# Patient Record
Sex: Female | Born: 1990
Health system: Southern US, Community
[De-identification: ages and names within clinical notes are randomized; demographics above are authoritative.]

## PROBLEM LIST (undated history)

## (undated) ENCOUNTER — Inpatient Hospital Stay (HOSPITAL_COMMUNITY): Payer: Self-pay

## (undated) DIAGNOSIS — M549 Dorsalgia, unspecified: Secondary | ICD-10-CM

## (undated) DIAGNOSIS — E669 Obesity, unspecified: Secondary | ICD-10-CM

## (undated) DIAGNOSIS — F1910 Other psychoactive substance abuse, uncomplicated: Secondary | ICD-10-CM

## (undated) DIAGNOSIS — R7303 Prediabetes: Secondary | ICD-10-CM

## (undated) DIAGNOSIS — T4145XA Adverse effect of unspecified anesthetic, initial encounter: Secondary | ICD-10-CM

## (undated) DIAGNOSIS — Z789 Other specified health status: Secondary | ICD-10-CM

## (undated) DIAGNOSIS — E282 Polycystic ovarian syndrome: Secondary | ICD-10-CM

## (undated) DIAGNOSIS — K760 Fatty (change of) liver, not elsewhere classified: Secondary | ICD-10-CM

## (undated) DIAGNOSIS — T8859XA Other complications of anesthesia, initial encounter: Secondary | ICD-10-CM

## (undated) DIAGNOSIS — R51 Headache: Secondary | ICD-10-CM

## (undated) DIAGNOSIS — I10 Essential (primary) hypertension: Secondary | ICD-10-CM

## (undated) DIAGNOSIS — I517 Cardiomegaly: Secondary | ICD-10-CM

## (undated) DIAGNOSIS — R0602 Shortness of breath: Secondary | ICD-10-CM

## (undated) DIAGNOSIS — O09299 Supervision of pregnancy with other poor reproductive or obstetric history, unspecified trimester: Secondary | ICD-10-CM

## (undated) DIAGNOSIS — R079 Chest pain, unspecified: Secondary | ICD-10-CM

## (undated) DIAGNOSIS — O139 Gestational [pregnancy-induced] hypertension without significant proteinuria, unspecified trimester: Secondary | ICD-10-CM

## (undated) DIAGNOSIS — E119 Type 2 diabetes mellitus without complications: Secondary | ICD-10-CM

## (undated) HISTORY — DX: Dorsalgia, unspecified: M54.9

## (undated) HISTORY — DX: Fatty (change of) liver, not elsewhere classified: K76.0

## (undated) HISTORY — DX: Prediabetes: R73.03

## (undated) HISTORY — DX: Essential (primary) hypertension: I10

## (undated) HISTORY — DX: Polycystic ovarian syndrome: E28.2

## (undated) HISTORY — DX: Other psychoactive substance abuse, uncomplicated: F19.10

## (undated) HISTORY — DX: Type 2 diabetes mellitus without complications: E11.9

## (undated) HISTORY — DX: Supervision of pregnancy with other poor reproductive or obstetric history, unspecified trimester: O09.299

## (undated) HISTORY — DX: Chest pain, unspecified: R07.9

---
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
## 2008-03-22 ENCOUNTER — Emergency Department (HOSPITAL_COMMUNITY): Admission: EM | Admit: 2008-03-22 | Discharge: 2008-03-22 | Payer: Self-pay | Admitting: Emergency Medicine

## 2008-04-02 ENCOUNTER — Inpatient Hospital Stay (HOSPITAL_COMMUNITY): Admission: AD | Admit: 2008-04-02 | Discharge: 2008-04-02 | Payer: Self-pay | Admitting: Obstetrics & Gynecology

## 2008-04-17 ENCOUNTER — Inpatient Hospital Stay (HOSPITAL_COMMUNITY): Admission: AD | Admit: 2008-04-17 | Discharge: 2008-04-18 | Payer: Self-pay | Admitting: Obstetrics and Gynecology

## 2008-06-14 ENCOUNTER — Ambulatory Visit (HOSPITAL_COMMUNITY): Admission: RE | Admit: 2008-06-14 | Discharge: 2008-06-14 | Payer: Self-pay | Admitting: Obstetrics

## 2008-09-13 ENCOUNTER — Ambulatory Visit (HOSPITAL_COMMUNITY): Admission: RE | Admit: 2008-09-13 | Discharge: 2008-09-13 | Payer: Self-pay | Admitting: Obstetrics

## 2008-11-15 ENCOUNTER — Inpatient Hospital Stay (HOSPITAL_COMMUNITY): Admission: AD | Admit: 2008-11-15 | Discharge: 2008-11-17 | Payer: Self-pay | Admitting: Obstetrics

## 2008-11-22 ENCOUNTER — Inpatient Hospital Stay (HOSPITAL_COMMUNITY): Admission: AD | Admit: 2008-11-22 | Discharge: 2008-11-27 | Payer: Self-pay | Admitting: Obstetrics & Gynecology

## 2008-11-24 ENCOUNTER — Encounter: Payer: Self-pay | Admitting: Obstetrics

## 2008-12-01 ENCOUNTER — Ambulatory Visit: Payer: Self-pay | Admitting: Cardiology

## 2008-12-01 ENCOUNTER — Inpatient Hospital Stay (HOSPITAL_COMMUNITY): Admission: AD | Admit: 2008-12-01 | Discharge: 2008-12-05 | Payer: Self-pay | Admitting: Obstetrics

## 2008-12-02 ENCOUNTER — Encounter (INDEPENDENT_AMBULATORY_CARE_PROVIDER_SITE_OTHER): Payer: Self-pay | Admitting: Emergency Medicine

## 2008-12-20 ENCOUNTER — Ambulatory Visit: Payer: Self-pay | Admitting: Cardiology

## 2008-12-27 ENCOUNTER — Ambulatory Visit: Payer: Self-pay

## 2008-12-27 ENCOUNTER — Encounter: Payer: Self-pay | Admitting: Cardiology

## 2009-02-12 DIAGNOSIS — I10 Essential (primary) hypertension: Secondary | ICD-10-CM

## 2009-02-12 DIAGNOSIS — E8770 Fluid overload, unspecified: Secondary | ICD-10-CM | POA: Insufficient documentation

## 2009-02-12 DIAGNOSIS — IMO0002 Reserved for concepts with insufficient information to code with codable children: Secondary | ICD-10-CM | POA: Insufficient documentation

## 2009-02-12 DIAGNOSIS — I517 Cardiomegaly: Secondary | ICD-10-CM | POA: Insufficient documentation

## 2009-02-12 DIAGNOSIS — I319 Disease of pericardium, unspecified: Secondary | ICD-10-CM | POA: Insufficient documentation

## 2009-02-13 ENCOUNTER — Encounter: Payer: Self-pay | Admitting: Cardiology

## 2009-02-13 ENCOUNTER — Ambulatory Visit: Payer: Self-pay | Admitting: Cardiology

## 2009-08-18 ENCOUNTER — Inpatient Hospital Stay (HOSPITAL_COMMUNITY): Admission: AD | Admit: 2009-08-18 | Discharge: 2009-08-18 | Payer: Self-pay | Admitting: Obstetrics & Gynecology

## 2009-08-29 ENCOUNTER — Inpatient Hospital Stay (HOSPITAL_COMMUNITY): Admission: AD | Admit: 2009-08-29 | Discharge: 2009-08-29 | Payer: Self-pay | Admitting: Obstetrics

## 2009-08-29 ENCOUNTER — Ambulatory Visit: Payer: Self-pay | Admitting: Family

## 2010-01-11 ENCOUNTER — Inpatient Hospital Stay (HOSPITAL_COMMUNITY): Admission: RE | Admit: 2010-01-11 | Discharge: 2010-01-14 | Payer: Self-pay | Admitting: Obstetrics

## 2010-09-24 ENCOUNTER — Emergency Department (HOSPITAL_COMMUNITY): Admission: EM | Admit: 2010-09-24 | Discharge: 2010-09-25 | Payer: Self-pay | Admitting: Emergency Medicine

## 2011-01-23 LAB — URINALYSIS, ROUTINE W REFLEX MICROSCOPIC
Nitrite: NEGATIVE
Specific Gravity, Urine: 1.018 (ref 1.005–1.030)
Urobilinogen, UA: 0.2 mg/dL (ref 0.0–1.0)
pH: 6.5 (ref 5.0–8.0)

## 2011-01-23 LAB — POCT PREGNANCY, URINE: Preg Test, Ur: NEGATIVE

## 2011-01-23 LAB — RAPID STREP SCREEN (MED CTR MEBANE ONLY): Streptococcus, Group A Screen (Direct): NEGATIVE

## 2011-02-03 LAB — CBC
HCT: 29.8 % — ABNORMAL LOW (ref 36.0–46.0)
Hemoglobin: 9.8 g/dL — ABNORMAL LOW (ref 12.0–15.0)
MCV: 84.8 fL (ref 78.0–100.0)
Platelets: 186 10*3/uL (ref 150–400)
RBC: 4.11 MIL/uL (ref 3.87–5.11)
RDW: 15 % (ref 11.5–15.5)
WBC: 10.3 10*3/uL (ref 4.0–10.5)
WBC: 10.9 10*3/uL — ABNORMAL HIGH (ref 4.0–10.5)

## 2011-02-03 LAB — RPR: RPR Ser Ql: NONREACTIVE

## 2011-02-25 LAB — URINE MICROSCOPIC-ADD ON

## 2011-02-25 LAB — BASIC METABOLIC PANEL
BUN: 4 mg/dL — ABNORMAL LOW (ref 6–23)
BUN: 5 mg/dL — ABNORMAL LOW (ref 6–23)
BUN: 8 mg/dL (ref 6–23)
CO2: 28 mEq/L (ref 19–32)
Calcium: 8 mg/dL — ABNORMAL LOW (ref 8.4–10.5)
Calcium: 8.2 mg/dL — ABNORMAL LOW (ref 8.4–10.5)
Calcium: 9.2 mg/dL (ref 8.4–10.5)
Chloride: 102 mEq/L (ref 96–112)
Chloride: 106 mEq/L (ref 96–112)
Creatinine, Ser: 0.75 mg/dL (ref 0.4–1.2)
Creatinine, Ser: 0.91 mg/dL (ref 0.4–1.2)
Creatinine, Ser: 0.91 mg/dL (ref 0.4–1.2)
GFR calc Af Amer: >60 mL/min (ref >60)
GFR calc Af Amer: >60 mL/min (ref >60)
GFR calc Af Amer: >60 mL/min (ref >60)
GFR calc non Af Amer: >60 mL/min (ref >60)
GFR calc non Af Amer: >60 mL/min (ref >60)
Glucose, Bld: 72 mg/dL (ref 70–99)
Glucose, Bld: 85 mg/dL (ref 70–99)
Potassium: 3.4 mEq/L — ABNORMAL LOW (ref 3.5–5.1)
Potassium: 4 mEq/L (ref 3.5–5.1)
Sodium: 141 mEq/L (ref 135–145)

## 2011-02-25 LAB — CBC
HCT: 31.2 % — ABNORMAL LOW (ref 36.0–46.0)
HCT: 23.9 % — ABNORMAL LOW (ref 36.0–46.0)
HCT: 24.3 % — ABNORMAL LOW (ref 36.0–46.0)
Hemoglobin: 10.5 g/dL — ABNORMAL LOW (ref 12.0–15.0)
Hemoglobin: 10.6 g/dL — ABNORMAL LOW (ref 12.0–15.0)
Hemoglobin: 8.1 g/dL — ABNORMAL LOW (ref 12.0–15.0)
Hemoglobin: 7.7 g/dL — CL (ref 12.0–15.0)
Hemoglobin: 8 g/dL — ABNORMAL LOW (ref 12.0–15.0)
Hemoglobin: 8.2 g/dL — ABNORMAL LOW (ref 12.0–15.0)
Hemoglobin: 8.6 g/dL — ABNORMAL LOW (ref 12.0–15.0)
MCHC: 33.4 g/dL (ref 30.0–36.0)
MCHC: 33.8 g/dL (ref 30.0–36.0)
MCHC: 34.4 g/dL (ref 30.0–36.0)
MCV: 84.9 fL (ref 78.0–100.0)
MCV: 86 fL (ref 78.0–100.0)
MCV: 85.6 fL (ref 78.0–100.0)
MCV: 86.8 fL (ref 78.0–100.0)
Platelets: 207 10*3/uL (ref 150–400)
Platelets: 213 10*3/uL (ref 150–400)
Platelets: 395 10*3/uL (ref 150–400)
RBC: 2.78 MIL/uL — ABNORMAL LOW (ref 3.87–5.11)
RBC: 3.66 MIL/uL — ABNORMAL LOW (ref 3.87–5.11)
RBC: 2.7 MIL/uL — ABNORMAL LOW (ref 3.87–5.11)
RBC: 2.75 MIL/uL — ABNORMAL LOW (ref 3.87–5.11)
RBC: 2.99 MIL/uL — ABNORMAL LOW (ref 3.87–5.11)
RDW: 14.7 % (ref 11.5–15.5)
RDW: 15.2 % (ref 11.5–15.5)
RDW: 15.2 % (ref 11.5–15.5)
RDW: 15.3 % (ref 11.5–15.5)
WBC: 12 10*3/uL — ABNORMAL HIGH (ref 4.0–10.5)
WBC: 12.3 10*3/uL — ABNORMAL HIGH (ref 4.0–10.5)
WBC: 13.2 10*3/uL — ABNORMAL HIGH (ref 4.0–10.5)
WBC: 10.2 10*3/uL (ref 4.0–10.5)
WBC: 10.5 10*3/uL (ref 4.0–10.5)
WBC: 6.2 10*3/uL (ref 4.0–10.5)

## 2011-02-25 LAB — COMPREHENSIVE METABOLIC PANEL
ALT: 21 U/L (ref 0–35)
ALT: 35 U/L (ref 0–35)
Albumin: 2.5 g/dL — ABNORMAL LOW (ref 3.5–5.2)
Albumin: 2.3 g/dL — ABNORMAL LOW (ref 3.5–5.2)
Albumin: 2.5 g/dL — ABNORMAL LOW (ref 3.5–5.2)
Alkaline Phosphatase: 210 U/L — ABNORMAL HIGH (ref 39–117)
Alkaline Phosphatase: 222 U/L — ABNORMAL HIGH (ref 39–117)
Alkaline Phosphatase: 123 U/L — ABNORMAL HIGH (ref 39–117)
Alkaline Phosphatase: 125 U/L — ABNORMAL HIGH (ref 39–117)
Alkaline Phosphatase: 130 U/L — ABNORMAL HIGH (ref 39–117)
BUN: 2 mg/dL — ABNORMAL LOW (ref 6–23)
BUN: 3 mg/dL — ABNORMAL LOW (ref 6–23)
BUN: 2 mg/dL — ABNORMAL LOW (ref 6–23)
BUN: 5 mg/dL — ABNORMAL LOW (ref 6–23)
CO2: 22 mEq/L (ref 19–32)
CO2: 25 mEq/L (ref 19–32)
CO2: 26 mEq/L (ref 19–32)
Calcium: 7.8 mg/dL — ABNORMAL LOW (ref 8.4–10.5)
Calcium: 8 mg/dL — ABNORMAL LOW (ref 8.4–10.5)
Chloride: 101 mEq/L (ref 96–112)
Chloride: 105 mEq/L (ref 96–112)
Chloride: 101 mEq/L (ref 96–112)
Chloride: 107 mEq/L (ref 96–112)
Chloride: 109 mEq/L (ref 96–112)
Creatinine, Ser: 0.59 mg/dL (ref 0.4–1.2)
Creatinine, Ser: 0.77 mg/dL (ref 0.4–1.2)
Creatinine, Ser: 0.81 mg/dL (ref 0.4–1.2)
GFR calc non Af Amer: >60 mL/min (ref >60)
GFR calc non Af Amer: >60 mL/min (ref >60)
GFR calc non Af Amer: >60 mL/min (ref >60)
Glucose, Bld: 85 mg/dL (ref 70–99)
Glucose, Bld: 93 mg/dL (ref 70–99)
Glucose, Bld: 87 mg/dL (ref 70–99)
Glucose, Bld: 89 mg/dL (ref 70–99)
Glucose, Bld: 97 mg/dL (ref 70–99)
Potassium: 3.5 mEq/L (ref 3.5–5.1)
Potassium: 3.6 mEq/L (ref 3.5–5.1)
Potassium: 3.2 mEq/L — ABNORMAL LOW (ref 3.5–5.1)
Potassium: 3.8 mEq/L (ref 3.5–5.1)
Sodium: 137 mEq/L (ref 135–145)
Sodium: 140 mEq/L (ref 135–145)
Total Bilirubin: 0.2 mg/dL — ABNORMAL LOW (ref 0.3–1.2)
Total Bilirubin: 0.5 mg/dL (ref 0.3–1.2)
Total Bilirubin: 0.2 mg/dL — ABNORMAL LOW (ref 0.3–1.2)
Total Bilirubin: 0.3 mg/dL (ref 0.3–1.2)
Total Bilirubin: 0.5 mg/dL (ref 0.3–1.2)
Total Protein: 6 g/dL (ref 6.0–8.3)
Total Protein: 6.3 g/dL (ref 6.0–8.3)

## 2011-02-25 LAB — URIC ACID
Uric Acid, Serum: 4.4 mg/dL (ref 2.4–7.0)
Uric Acid, Serum: 4.9 mg/dL (ref 2.4–7.0)
Uric Acid, Serum: 6.6 mg/dL (ref 2.4–7.0)
Uric Acid, Serum: 7.4 mg/dL — ABNORMAL HIGH (ref 2.4–7.0)
Uric Acid, Serum: 7.8 mg/dL — ABNORMAL HIGH (ref 2.4–7.0)

## 2011-02-25 LAB — MAGNESIUM
Magnesium: 3.9 mg/dL — ABNORMAL HIGH (ref 1.5–2.5)
Magnesium: 4.3 mg/dL — ABNORMAL HIGH (ref 1.5–2.5)

## 2011-02-25 LAB — URINALYSIS, ROUTINE W REFLEX MICROSCOPIC
Bilirubin Urine: NEGATIVE
Bilirubin Urine: NEGATIVE
Glucose, UA: NEGATIVE mg/dL
Ketones, ur: NEGATIVE mg/dL
Ketones, ur: NEGATIVE mg/dL
Nitrite: NEGATIVE
Protein, ur: NEGATIVE mg/dL
Protein, ur: 30 mg/dL — AB
Urobilinogen, UA: 0.2 mg/dL (ref 0.0–1.0)
Urobilinogen, UA: 0.2 mg/dL (ref 0.0–1.0)

## 2011-02-25 LAB — RPR
RPR Ser Ql: NONREACTIVE
RPR Ser Ql: NONREACTIVE

## 2011-02-25 LAB — LACTATE DEHYDROGENASE
LDH: 152 U/L (ref 94–250)
LDH: 180 U/L (ref 94–250)
LDH: 292 U/L — ABNORMAL HIGH (ref 94–250)
LDH: 311 U/L — ABNORMAL HIGH (ref 94–250)

## 2011-02-25 LAB — PHOSPHORUS: Phosphorus: 4.4 mg/dL (ref 2.3–4.6)

## 2011-03-26 NOTE — Discharge Summary (Signed)
Frances [name], Frances [name]              ACCOUNT NO.:  [id number]   MEDICAL RECORD NO.:  [id number]          PATIENT TYPE:  INP   LOCATION:  9306                          [facility]ILITY:  [facility]   PHYSICIAN:  Charles A. Clearance Coots, M.D.DATE OF BIRTH:  [birth year]   DATE OF ADMISSION:  12/01/2008  DATE OF DISCHARGE:  12/05/2008                               DISCHARGE SUMMARY   ADMITTING DIAGNOSIS:  Postpartum preeclampsia.   DISCHARGE DIAGNOSES:  1. Postpartum preeclampsia.  2. Pulmonary edema.  3. Discharged home on post hospital day #4.  Much improved.  Good      condition.   NARRATIVE:  An 20 year old G1, P1, status post cesarean section on  November 24, 2008, presented to the office with shortness of breath.  On  evaluation, blood pressure was noted to be 166/108.  The patient was  sent to [facility] for workup and admission for  preeclampsia.   PAST MEDICAL HISTORY:  SURGERY:  Cesarean section.  ILLNESSES:  None.   MEDICATIONS:  Prenatal vitamins, labetalol, Percocet, and ibuprofen.   ALLERGIES:  No known drug allergies.   SOCIAL HISTORY:  Single.  Positive tobacco.  Negative alcohol or  recreational drug.   PHYSICAL EXAMINATION:  GENERAL:  Afebrile.  VITAL SIGNS:  Blood pressure 166/104 and weight 340 pounds.  LUNGS:  Clear to auscultation bilaterally.  HEART:  Regular rate and rhythm.  ABDOMEN:  Soft, nontender, and obese.  Incision is clean, dry and  intact.   ADMITTING LABS:  Hemoglobin of 7.7, hematocrit 23.9, white blood cell  count 10,500, and platelets 373,000.  Comprehensive metabolic panel was  within normal limits.   HOSPITAL COURSE:  The patient was admitted and started on IV magnesium  sulfate prophylaxis for seizure.  She was also started on  antihypertensive therapy with labetalol.  She continued to have some  elevations in her blood pressure and was also started on Lasix for  diuresis.  Chest x-ray was obtained and revealed developing pulmonary  edema.  The patient was continued on Lasix along with the  antihypertensive therapy and showed rapid brisk diuresis, losing 30  pounds of water weight overnight on the first day of admission.  She  continued to diurese quite well and showed great improvement.  Cardiology consultation was obtained and cardiac echo was obtained,  which showed 65% ejection fraction, but some pleural effusions.  The  patient continued to do well and still quite well on hospital day #4 and  was therefore discharged home for followup in 2 weeks with Obstetrics  and with 2 weeks with Cardiology for followup.   DISCHARGE LABS:  Hemoglobin 8.6, hematocrit 25.7, white blood cell count  10,200, and platelets 455,000.  Comprehensive metabolic panel was within  normal limits with a remarkable for potassium 4.9.   DISCHARGE DISPOSITION:   MEDICATIONS:  Labetalol, and hydrochlorothiazide, Norvasc was prescribed  for blood pressure control.  Lasix was also prescribed.  Continue taking  medications that were taken at home.  The patient is also supplemented  with potassium and KCl 10 mg daily.   DISCHARGE FOLLOWUP:  Discharge followup  is with the [facility] to be followed up in [redacted] weeks along with Cardiology followup in 2  weeks.      Charles A. Clearance Coots, M.D.  Electronically Signed     CAH/MEDQ  D:  12/05/2008  T:  12/05/2008  Job:  [id number]

## 2011-03-26 NOTE — Assessment & Plan Note (Signed)
[facility]                            CARDIOLOGY OFFICE NOTE   NAME:Barkey, JAMESHIA HAYASHIDA                     MRN:          [id number]  DATE:12/20/2008                            DOB:          Jun 01, 1991    Ms. Makarewicz was here for cardiology followup after I saw her in  consultation at [facility].  She delivered a baby and then came  back in markedly volume overloaded.  It was felt that she had postpartum  preeclampsia.  She had pulmonary edema.  She diuresed in the range of 30  pounds or more while in the hospital.  She was treated with labetalol  and Norvasc in the hospital along with furosemide.  Her edema has not  returned clinically.  She says she has gained some weight but she thinks  she is eating more.  In the hospital with her body habitus she was able  to have an enormous amount of fluid, some of which showed as edema and  some did not.  She is feeling well.   PAST MEDICAL HISTORY:   ALLERGIES:  PENICILLIN.   MEDICATIONS:  Neevo caplet, labetalol, hydrochlorothiazide, Maxaron  Forte, Klor-Con and furosemide.   OTHER MEDICAL PROBLEMS:  See the list below.   REVIEW OF SYSTEMS:  She feels great.  She is not having any GI or GU  symptoms.  Her review of systems otherwise is negative.   PHYSICAL EXAM:  Blood pressure 124/58 with a pulse of 81.  The patient  is oriented to person, time and place.  Affect is normal.  HEENT:  Reveals no xanthelasma.  She has normal extraocular motion.  There are no carotid bruits.  There is no jugular venous distention.  LUNGS:  Clear.  Respiratory effort is not labored.  CARDIAC EXAM:  Reveals and S1 and S2.  There are no clicks or  significant murmurs.  ABDOMEN:  Soft.  She has possibility of trace peripheral edema today.   EKG is normal.  During the hospitalization she had a 2D echo.  Her  ejection fraction was 65%.  There were no wall motion abnormalities.  There was moderate left ventricular  hypertrophy.  Right ventricular  function was good.  There was a small to moderate pericardial effusion  circumferential to the heart.   PROBLEMS INCLUDE:  1. History of postpartum preeclampsia.  2. Severe hypertension.  3. Marked volume overload with a diuresis in the range of 30 pounds or      more.  4. Moderate left ventricular hypertrophy by echo.  5. Small to moderate pericardial effusion at the time that she was      admitted.  6. History of PENICILLIN allergy.   The patient is actually doing well.  Dr. Clearance Coots has raised the question  as to whether or not her blood pressure medicines can be stopped.  The  patient mentioned this to me.  As of today, I am hesitant to stop all of  her medicines.  I believe she may need medicines on the long-term for  her blood pressure because of her history of left ventricular  hypertrophy.  I am also hesitant to stop all of her diuretics.  In  addition, we need to know more about her pericardium and her pericardial  effusion.  On this basis, I will stop her furosemide today but keep her  on hydrochlorothiazide.  Her labetalol will be continued.  She will then  see Dr. Clearance Coots in followup.  I will plan to see her in followup after  that over time.  I also need to clarify the medication Tereso Newcomer, as  I cannot document exactly what it is.     Luis Abed, MD, [facility]  Electronically Signed    JDK/MedQ  DD: 12/20/2008  DT: 12/20/2008  Job #: [id number]   cc:   Leonette Most A. Clearance Coots, M.D.

## 2011-03-26 NOTE — Assessment & Plan Note (Signed)
[facility]                            CARDIOLOGY OFFICE NOTE   NAME:Frances [name], Frances [name]                       MRN:          [id number]  DATE:12/05/2008                            DOB:          1990/12/09    [name] is 20 years of age.  She delivered successfully at [facility] and went home.  She then came back in short of breath.  She was  markedly volume overloaded.  I am not sure the exact numbers, but it  appears that she may have diuresed in the range of 30 pounds or possibly  even more.  There is question as much as 47 pounds.  She is overweight  at baseline.  She had had some peripheral edema during her pregnancy.  She was also hypertensive.  There was of course concern that she could  have a peripartum cardiomyopathy.  2-D echo was done.  Ejection fraction  was 65%.  She does have moderate left ventricular hypertrophy and a  small-to-moderate circumferential pericardial effusion.  She had  dramatic diuresis.  She did require potassium with her diuresis but a  surprisingly relative small amount relative to the huge diuresis.   As of this morning, she is much improved and ready to go home.  Problems  include,  1. Treatment of potassium.  Her potassium this morning is 4.9.  She      did receive 80 of potassium yesterday.  She has a small potassium      requirement.  2. Marked volume overload.  This has greatly improved.  I have      encouraged her to get a scale for home to try to follow the trend      in her weight.  3. Good LV function.  4. Moderate left ventricular hypertrophy.  The patient says that she      was not known to be hypertensive in the past.  This will have to be      followed carefully.  5. Mild-to-moderate pericardial effusion.  I believe that this will      resolve over time and we will follow this as an outpatient.  6. Hypertension.  She is on appropriate medications at this time.  7. Postpartum.  8. Anemia.  This  is being followed and treated with iron.   It appears that the patient had hypertension and volume overload related  to her pregnancy.  She certainly had problems with hypertension.  She  has good LV function.  I have recommended that she go home on Lasix 40  and potassium 10 mEq daily along with her blood pressure meds which  currently include Norvasc and Normodyne.  I have suggested that she have  early followup with her OB/GYN doctors and I will arrange for followup  in our office in 2-3 weeks to follow up her pericardial effusion.     Luis Abed, MD, [facility]  Electronically Signed    JDK/MedQ  DD: 12/05/2008  DT: 12/05/2008  Job #: [id number]

## 2011-03-26 NOTE — Consult Note (Signed)
Frances [name], Frances [name]              ACCOUNT NO.:  [id number]   MEDICAL RECORD NO.:  [id number]          PATIENT TYPE:  INP   LOCATION:                                [facility]   PHYSICIAN:  Luis Abed, MD, FACCDATE OF BIRTH:  December 18, 1990   DATE OF CONSULTATION:  12/02/2008  DATE OF DISCHARGE:                                 CONSULTATION   PRIMARY CARE PHYSICIAN:  Charles A. Clearance Coots, MD   PRIMARY CARDIOLOGIST:  Will be Luis Abed, MD, [facility]   CHIEF COMPLAINT:  Possible cardiomyopathy.   HISTORY OF PRESENT ILLNESS:  Frances [name] is a 20 year old female with no  previous cardiac history.  Frances [name] was hospitalized on November 15, 2008,  through November 17, 2008, in an attempt to induce labor which was  unsuccessful.  Frances [name] came back to Frances [name] on November 22, 2008, and  the plan was initially to induce labor; however, this was unsuccessful  and because of fetal size Frances [name] had a C-section on November 24, 2008, and  went home on November 27, 2008.  The baby has done well.  After  discharge, Frances [name] noted that her lower extremity edema which Frances [name]  had during the last part of her pregnancy did not improve.  Frances [name] did not  feel that it got any worse.  Frances [name] did not weigh herself and is not aware  of any weight changes.  Frances [name] had more shortness of breath, and when Frances [name]  took the baby in for a circumcision Frances [name] was checked and found to be  hypertensive.  Frances [name] was sent to the [facility] Emergency Room and  admitted for further evaluation and diuresis.  Since admission, Frances [name] has  diuresed more than 14 L of fluid.  Her weight has gone down  significantly, as much as 30 pounds overnight.  Frances [name] has been given  multiple medications for her blood pressure and this has also improved.  Frances [name] is feeling much better now, but because of cardiac enlargement on  chest x-ray and no improvement on her lower extremity edema, Cardiology  was asked to evaluate her.  Of note, Frances [name] had proteinuria along with the  hypertension and was felt to be pre-eclamptic, so Frances [name] was given  magnesium IV as well.  Frances [name] has not had any seizure activity.   PAST MEDICAL HISTORY:  1. Obesity.  2. Borderline hypertension.  3. Tobacco use.  4. Anemia which is a new diagnosis for her since her pregnancy.   SURGICAL HISTORY:  C-section.   ALLERGIES:  PENICILLIN.   CURRENT MEDICATIONS:  1. Lasix 40 mg IV, two doses yesterday and one today.  2. K-Dur 40 mEq x1.  3. Norvasc 5 mg daily.  4. Hydrochlorothiazide 25 mg daily.  5. Labetalol 300 mg t.i.d.  6. Ativan 0.5 mg q.8 h.  7. Niferex 2 tablets daily.   All medications are new this admission.   SOCIAL HISTORY:  Frances [name] lives in [place] with her mother and is  currently unemployed.  Frances [name] has an approximately 5 pack-year history of  tobacco use, but has only smoked about 6  cigarettes says Frances [name] was  discharged after her C-section and promises to quit.  Frances [name] denies alcohol  or drug abuse.   FAMILY HISTORY:  Her mother is alive at age 64 and her father is also in  his mid 90s, neither one has any history of heart disease or heart  failure and no siblings have any cardiac issues.   REVIEW OF SYSTEMS:  Frances [name] has not had fevers, chills or sweats.  There is  been no dysuria.  Frances [name] has had shortness of breath as well as increasing  dyspnea on exertion, orthopnea, PND, and edema.  Frances [name] denies  palpitations, although Frances [name] has had some brief runs of nonsustained VT.  Frances [name] has not had coughing or wheezing.  Frances [name] has had no presyncope or  syncope.  Frances [name] has had some abdominal pain at the incision, but review of  systems is otherwise negative.   PHYSICAL EXAMINATION:  VITAL SIGNS:  Temperature is 98.1, blood pressure  153/85, pulse 94, respiratory rate 31, O2 saturation 99% on 4 L.  GENERAL:  Frances [name] is a well-developed obese African American female in no  acute distress.  HEENT:  Normal.  NECK:  There is no lymphadenopathy, thyromegaly, or bruits noted.  There  is minimal JVD.  CVA:   Her heart is regular in rate and rhythm with an S1 and S2 and an  S4, but no significant murmur or rub is noted.  Distal pulses are intact  in all 4 extremities.  LUNGS:  Frances [name] has some rales and a few crackles at the bases.  SKIN:  No rashes or lesions are noted.  ABDOMEN:  Soft and tender near the incision with active bowel sounds.  EXTREMITIES:  There is no cyanosis or clubbing noted.  Frances [name] has 2-3+  lower extremity edema.  MUSCULOSKELETAL:  There is no joint deformity or effusions and no spine  or CVA tenderness.  NEUROLOGIC:  Frances [name] is alert and oriented.  Cranial nerves II through XII  grossly intact.   Chest x-ray performed on December 02, 2008, shows improved edema with  cardiac enlargement and mild right lower lobe infiltrate which is new.  Chest x-ray on December 01, 2008, showed mild edema.   EKG:  Sinus rhythm, rate 89, with no acute ischemic changes.   LABORATORY VALUES:  Hemoglobin initially 7.7 now 8.2, hematocrit 24.3,  WBCs 10.4, platelets 395.  Sodium 146, potassium 4.0, chloride 109, CO2  30, BUN 2, creatinine 0.77, glucose 87, magnesium today 4.3.   IMPRESSION:  Frances [name] was seen today by Dr. Myrtis Ser.  1. Preeclampsia:  Frances [name] has been well treated for this by her primary      care physician, and her blood pressure is much improved.  2. Volume overload, possible cardiomyopathy:  It is appropriate to      continue diuresis and to that end we will schedule Lasix 40 mg IV      b.i.d.  We will recheck of a BMET now and continue to follow her      renal function      and potassium levels closely.  An echocardiogram has been ordered      and should be done today.  At this point, we do not know if her      volume overload is secondary to hypertension and preeclampsia or      cardiomyopathy.  Frances [name] has had dramatic, greater than 15 L diuresis      in 24 hours.  We will continue to follow  her closely with you.      Theodore Demark, PA-C      Luis Abed, MD, [name]   Electronically Signed    RB/MEDQ  D:  12/02/2008  T:  12/03/2008  Job:  [id number]

## 2011-03-26 NOTE — H&P (Signed)
NAMEAUDINE, MANGIONE              ACCOUNT NO.:  [id number]   MEDICAL RECORD NO.:  [id number]          PATIENT TYPE:  INP   LOCATION:  9169                          [facility]ILITY:  [facility]   PHYSICIAN:  Roseanna Rainbow, M.D.DATE OF BIRTH:  1991/06/04   DATE OF ADMISSION:  11/15/2008  DATE OF DISCHARGE:                              HISTORY & PHYSICAL   CHIEF COMPLAINT:  The patient is a 20 year old para 0 with an estimated  date of confinement of November 12, 2008, for an induction of labor.   HISTORY OF PRESENT ILLNESS:  Please see the above.  Per the patient's  report on her last several visits in the office, her blood pressures  have been elevated.  Upon review of her charts, her blood pressures have  been somewhat elevated as early as blood pressure reading of 142/84 at  her 14-week visit.   ALLERGIES:  PENICILLIN.   MEDICATIONS:  None.   PRENATAL LABORATORIES:  Chlamydia probe negative.  GC probe negative.  One-hour GTT 109.  Hepatitis B surface antigen negative.  Hematocrit  32.5, hemoglobin 10.9.  HIV nonreactive.  Quad screen is negative.  Blood type is A positive.  Antibody screen negative.  RPR nonreactive.  Rubella immune.  Sickle cell negative.   PAST GYN HISTORY:  Noncontributory.   PAST MEDICAL HISTORY:  No significant history of medical diseases.   PAST SURGICAL HISTORY:  No previous surgery.   SOCIAL HISTORY:  She is unemployed, single, does not give any  significant history of alcohol usage, has no significant smoking  history, denies illicit drug use.   FAMILY HISTORY:  No major illnesses known.   PHYSICAL EXAMINATION:  Vital signs stable.  Blood pressures 140s over  60s.  Fetal heart tracing reassuring.  Tocodynamometer, rare uterine  contractions.  Sterile vaginal exam per the RN, cervix is closed and  50%.   ASSESSMENT:  Primigravida at 40 plus weeks for induction of labor,  questionable gestational hypertension versus chronic hypertension.  Fetal heart  tracing consistent with fetal well-being.  Unfavorable  Bishop score.   PLAN:  1. Admission, check a PIH panel.  2. Two stage induction of labor.      Roseanna Rainbow, M.D.  Electronically Signed     LAJ/MEDQ  D:  11/16/2008  T:  11/16/2008  Job:  [id number]

## 2011-03-26 NOTE — Op Note (Signed)
[name], [name]              ACCOUNT NO.:  [id number]   MEDICAL RECORD NO.:  [id number]          PATIENT TYPE:  INP   LOCATION:  9125                          [facility]  [facility]   PHYSICIAN:  Charles A. Clearance Coots, M.D.DATE OF BIRTH:  06/01/1991   DATE OF PROCEDURE:  DATE OF DISCHARGE:                               OPERATIVE REPORT   PREOPERATIVE DIAGNOSES:  1. Arrest of dilatation and descent.  2. Large for gestational age fetus.   POSTOPERATIVE DIAGNOSES:  1. Arrest of dilatation and descent.  2. Large for gestational age fetus.  3. Macrosomia.   PROCEDURE:  Primary low-transverse cesarean section.   SURGEON:  Charles A. Clearance Coots, MD   ANESTHESIA:  Epidural.   ESTIMATED BLOOD LOSS:  1000 mL.   IV FLUIDS:  2200 mL.   URINE OUTPUT:  100 mL.   COMPLICATIONS:  None.   Foley to gravity.   FINDINGS:  Viable female at 0418, Apgars of 8 at one minute and 9 at five  minutes, weight of 11 pounds and 5 ounces.  Normal uterus, ovaries, and  fallopian tubes.   OPERATION:  The patient was brought to the operating room and after  satisfactory re-dosing of the epidural, the abdomen was prepped and  draped in usual sterile fashion.  Pfannenstiel skin incision was made  with a scalpel that was deepened down to the fascia with a scalpel.  Fascia was nicked in the midline and the fascial incision was extended  to the left and to the right with curved Mayo scissors.  The superior  and inferior fascial edges were taken off the rectus muscles with blunt  and sharp dissection.  The rectus muscles were bluntly divided in the  midline.  Peritoneum was entered digitally and was digitally extended to  the left and to the right.  The bladder blade was positioned in the  incision.  The vesicouterine fold of peritoneum above the reflection of  the urinary bladder was grasped with forceps and was incised and  undermined with Metzenbaum scissors.  The incision was extended to the  left and to the  right with Metzenbaum scissors.  The bladder blade was  repositioned in front of the urinary bladder placing it well out of the  operative field.  The uterus was then entered transversely in the lower  uterine segment with a scalpel.  Clear amniotic fluid was expelled.  The  uterine incision was extended to the left and to the right digitally.  The occiput was then brought up into the incision, but was not able to  deliver through the incision because of hyperextension of the vertex.  Mushroom Mityvac cap was then applied to the occiput and the vertex was  flexed into the incision and was delivered with the aid of fundal  pressure from the assistant and vacuum extraction.  The infant's mouth  and nose were suctioned with a suction bulb and delivery was completed  with the aid of fundal pressure from the assistant.  Umbilical cord was  doubly clamped and cut, and the infant was handed off to the nursery  staff.  The placenta was then spontaneously expelled from the uterine  cavity intact.  The endometrial surface was thoroughly debrided with a  dry lap sponge.  The edges of the uterine incision were grasped with  ring forceps.  Uterus was closed with a continuous interlocking suture  of 0-Monocryl from each corner to the center.  Hemostasis was excellent.  Pelvic cavity was then thoroughly irrigated with warm saline solution.  All clots were removed.  The abdomen was then closed as follows.  The  parietal peritoneum was closed with continuous suture of 2-0 Monocryl.  The fascia was closed with a continuous suture of 0-Vicryl.  Subcutaneous tissue was thoroughly irrigated with warm saline solution.  All areas of subcutaneous bleeding were coagulated with the Bovie.  Skin  was then closed with a continuous subcuticular suture of 4-0 Monocryl.  Sterile bandage was applied to the incision closure.  Surgical  technician indicated that all needle, sponge, and instrument counts were  correct x2.   The patient tolerated the procedure well, transported to  the recovery room in satisfactory condition.      Charles A. Clearance Coots, M.D.  Electronically Signed     CAH/MEDQ  D:  11/24/2008  T:  11/24/2008  Job:  [id number]

## 2011-03-26 NOTE — H&P (Signed)
Frances [name], Frances [name]              ACCOUNT NO.:  [id number]   MEDICAL RECORD NO.:  [id number]          PATIENT TYPE:  INP   LOCATION:  9169                          FACILITY:  [facility]   PHYSICIAN:  Roseanna Rainbow, M.D.DATE OF BIRTH:  05-Feb-1991   DATE OF ADMISSION:  11/22/2008  DATE OF DISCHARGE:                              HISTORY & PHYSICAL   CHIEF COMPLAINT:  The patient is a 20 year old with an intrauterine  pregnancy at 41 plus weeks for induction of labor.   HISTORY OF PRESENT ILLNESS:  Please see the above.   ALLERGIES:  PENICILLIN.   MEDICATIONS:  Prenatal vitamins.   OB RISK FACTORS:  Obesity, borderline blood pressure elevations.   PRENATAL LABORATORIES:  Chlamydia probe IS negative.  GC probe negative.  One-hour GTT 109.  Hepatitis B surface antigen negative.  Hematocrit  32.5, hemoglobin 10.9.  HIV nonreactive.  Quad screen negative.  Platelets 288,000.  Blood type is A positive.  Antibody screen negative.  RPR nonreactive.  Rubella immune.  Sickle cell negative.   PAST OBSTETRICAL HISTORY:  There is a history of a voluntary termination  of pregnancy.   PAST GYN HISTORY:  Noncontributory.   PAST MEDICAL HISTORY:  No significant history of medical diseases.   PAST SURGICAL HISTORY:  No previous surgery.   SOCIAL HISTORY:  She is unemployed, single, does not give any  significant history of alcohol usage, has no significant smoking  history.  Denies illicit drug use.   FAMILY HISTORY:  No major illnesses known.   PHYSICAL EXAMINATION:  VITAL SIGNS:  Blood pressures 120s to 150s over  60s to 70s.  Fetal heart tracing reassuring.  Tocodynamometer irregular  uterine contractions.  GENERAL:  Obese African American female in no apparent distress.  ABDOMEN:  Gravid and nontender.  Estimated fetal weight by Thayer Ohm 3600  to 4000 g.  Sterile vaginal exam per the RN, the cervix is 2 cm dilated,  50% effaced.   ASSESSMENT:  Intrauterine pregnancy at 41 plus  weeks, borderline Bishop  score, fetal heart tracing consistent with fetal well-being, borderline  blood pressure elevations.   PLAN:  Admission.  We will check a PIH panel, two-stage induction of  labor.      Roseanna Rainbow, M.D.  Electronically Signed     LAJ/MEDQ  D:  11/22/2008  T:  11/23/2008  Job:  [id number]

## 2011-03-29 NOTE — Discharge Summary (Signed)
NAMEARYANNAH, MOHON              ACCOUNT NO.:  [id number]   MEDICAL RECORD NO.:  [id number]          PATIENT TYPE:  INP   LOCATION:  9125                          FACILITY:  [facility]   PHYSICIAN:  Charles A. Clearance Coots, M.D.DATE OF BIRTH:  [birth year]   DATE OF ADMISSION:  11/22/2008  DATE OF DISCHARGE:  11/27/2008                               DISCHARGE SUMMARY   ADMITTING DIAGNOSES:  A 41 plus weeks' gestation, borderline elevated  blood pressures, induction of labor.   DISCHARGE DIAGNOSES:  A 41 plus weeks' gestation, borderline elevated  blood pressures, induction of labor, status post primary low transverse  cesarean section for arrest of dilatation and descent.  Viable female was  delivered on November 24, 2008, at 0418, Apgars of 8 at 1 minute and 9 at  5 minutes, weight of 11 pounds and 5 ounces.  Mother and infant  discharged home in good condition.   REASON FOR ADMISSION:  A 20 year old G2, P0-0-1-0 female.  Estimated  date of confinement is November 12, 2008, presents at 73 plus weeks'  gestation for induction of labor for postdates and borderline elevations  of blood pressure.   PAST MEDICAL HISTORY:  Surgery, none.  Illnesses, none.   MEDICATIONS:  Prenatal vitamins.   ALLERGIES:  PENICILLIN.   SOCIAL HISTORY:  Single.  Negative tobacco, alcohol, and recreational  drug use.   FAMILY HISTORY:  No major illnesses listed.   PHYSICAL EXAMINATION:  GENERAL:  Obese black female in no acute  distress.  VITAL SIGNS:  Blood pressures were 120s-150s over 60s-70s.  Fetal heart  tracing was reassuring.  Tocodynamometer revealed irregular uterine  contractions.  LUNGS:  Clear to auscultation bilaterally.  HEART:  Regular rate and rhythm.  ABDOMEN:  Obese, gravid with Thayer Ohm exam with an estimated fetal weight  of 3600-4000 g.  CERVIX:  2 cm dilated, 50% effaced, and vertex at minus 3 station.   ADMITTING LABORATORY DATA:  Hemoglobin 10, hematocrit 31, white blood  cell count  12,000, platelets 213,000.  Comprehensive metabolic panel was  within normal limits.  RPR was nonreactive.   HOSPITAL COURSE:  The patient was admitted and 2-stage induction of  labor was instituted.  Pitocin was started and the patient progressed to  6 cm dilatation, vertex at minus 2 station, and made no further progress  after that point for greater than 3 hours with adequate labor forces as  indicated by the tocodynamometer measurement of forces of contraction  amounts of the daily units.  The anterior lip of the cervix also became  swollen and there was significant increase in molding of the vertex.  A  decision was made to proceed with cesarean section delivery after  greater than 4 hours of no descent and a clinical suspicion of large for  gestational age fetus.  Primary low transverse cesarean section was  performed on November 24, 2008.  There were no intraoperative  complications.  Postoperative course was uncomplicated.  The patient was  discharged home on postop day #3 in good condition.   DISCHARGE LABORATORY DATA:  Hemoglobin 8, hematocrit 23, white blood  cell count 6200, platelets 239,000.  The patient did have anemia  postoperatively, but she was clinically stable with no orthostatic  changes or dizziness or headaches or difficulty with ambulation.  The  decision was made not to offer transfusion of blood products based on  her clinical condition being very stable.   DISCHARGE DISPOSITION:   MEDICATIONS:  Continue prenatal vitamins.  Ibuprofen and Percocet was  prescribed for pain.  Routine written instructions were given for  discharge after cesarean section.  The patient is to call office for a  followup appointment in 2 weeks.      Charles A. Clearance Coots, M.D.  Electronically Signed     CAH/MEDQ  D:  01/13/2009  T:  01/14/2009  Job:  [id number]

## 2011-03-29 NOTE — Discharge Summary (Signed)
NAMEJAYLISE, PEEK              ACCOUNT NO.:  [id number]   MEDICAL RECORD NO.:  [id number]          PATIENT TYPE:  INP   LOCATION:  9125                          FACILITY:  [facility]   PHYSICIAN:  Roseanna Rainbow, M.D.DATE OF BIRTH:  [birth year]   DATE OF ADMISSION:  11/22/2008  DATE OF DISCHARGE:  11/27/2008                               DISCHARGE SUMMARY   CHIEF COMPLAINT:  The patient is a 20 year old with an intrauterine  pregnancy at 41 plus weeks, who presents for induction of labor  secondary to postdates.  Please see the dictated history and physical.   HOSPITAL COURSE:  The patient was admitted.  She was also noted to have  blood pressure elevations and symptoms as well as a normal PIH panel  were consistent with likely gestational hypertension.  On the second day  of induction, she was thought to have rupture of membranes in latent  labor.  She progressed to 6 cm of dilatation.  There was no further  dilatation, at which point the decision was made to proceed with a  cesarean delivery.  Please see the dictated operative summary.  On  postoperative day #1, her hemoglobin was 8.  She was stable.  Her blood  pressures remained stable.  She was discharged to home on postoperative  day #3.   DISCHARGE DIAGNOSES:  Intrauterine pregnancy at 41 plus weeks,  gestational hypertension, arrest of dilatation, active phase of labor.   PROCEDURE:  Cesarean delivery.   CONDITION:  Stable.   DIET:  Regular.   ACTIVITY:  Progressive activity, pelvic rest.   MEDICATIONS:  Percocet, ibuprofen, and Labetalol.   DISPOSITION:  She was to follow up in the office in 2 days.  Postoperatively, the patient  had been started on p.o. labetalol to  optimize her blood pressure control.      Roseanna Rainbow, M.D.  Electronically Signed     LAJ/MEDQ  D:  01/08/2009  T:  01/08/2009  Job:  [id number]

## 2011-03-29 NOTE — Discharge Summary (Signed)
Frances [name], [name]              ACCOUNT NO.:  [id number]   MEDICAL RECORD NO.:  [id number]          PATIENT TYPE:  INP   LOCATION:  9169                          FACILITY:  [facility]   PHYSICIAN:  Charles A. Clearance Coots, M.D.DATE OF BIRTH:  [birth year]   DATE OF ADMISSION:  11/15/2008  DATE OF DISCHARGE:  11/17/2008                               DISCHARGE SUMMARY   ADMITTING DIAGNOSES:  1. Postdates pregnancy.  2. Elevated blood pressures.  3. Induction of labor.   DISCHARGE DIAGNOSES:  1. Postdates pregnancy.  2. Elevated blood pressures.  3. Induction of labor.  4. Failed induction of labor.  5. Discharged home, undelivered at 40 plus weeks gestation in good      condition.   REASON FOR ADMISSION:  A 20 year old para 0 with estimated date of  confinement of November 12, 2008, presents for induction of labor.  The  patient reports on her last several visit to the office her blood  pressures have been elevated.   PAST MEDICAL HISTORY:  SURGERY:  None.  ILLNESSES:  None.   MEDICATIONS:  Prenatal vitamin.   ALLERGIES:  PENICILLIN.   SOCIAL HISTORY:  Single.  Negative tobacco, alcohol, or recreational  drug use.   FAMILY HISTORY:  No major illnesses noticed.   PHYSICAL EXAMINATION:  GENERAL:  Well-nourished, well-developed female  in no acute distress.  She is afebrile.  VITAL SIGNS:  Blood pressures were 146/60.  LUNGS:  Clear to auscultation bilaterally.  HEART:  Regular rate and rhythm.  ABDOMEN:  Gravid, nontender.  PELVIC:  Cervix is closed. 50% effaced. and vertex at a -3 station.   ADMITTING LABORATORY DATA:  Hemoglobin 10, hematocrit 31, white blood  cell count 12,300, and platelets 221,000.  Comprehensive metabolic panel  was within normal limits.   HOSPITAL COURSE:  The patient was admitted and cervical ripening was  begun with Cytotec and Pitocin was started the following morning.  She  made no progress on day 1 of induction and she was rested overnight and  cervical ripening was again done.  On day 2 of induction also remarkable  for no cervical change or progressing.  A decision was made to stop the  induction of labor because of very unripe cervix.  Reassuring fetal  heart rate monitoring throughout the course of induction.  The patient  was therefore discharged to home undelivered at 40-4/7th weeks'  gestation after failed induction of labor.  The patient was discharged  home in good condition.   DISCHARGE LABORATORY DATA:  Hemoglobin 10.6, hematocrit 31.2, white  blood cell count 13,200, and platelets 207,000.   DISCHARGE DISPOSITION:  Medications, continue prenatal vitamins.  Instructions were given to return to the hospital for reinduction of  labor at 41 weeks' gestation.      Charles A. Clearance Coots, M.D.  Electronically Signed     CAH/MEDQ  D:  01/13/2009  T:  01/14/2009  Job:  [id number]

## 2011-08-07 LAB — RAPID STREP SCREEN (MED CTR MEBANE ONLY): Streptococcus, Group A Screen (Direct): NEGATIVE

## 2011-08-07 LAB — STREP A DNA PROBE

## 2012-07-17 ENCOUNTER — Encounter (HOSPITAL_COMMUNITY): Payer: Self-pay | Admitting: Family

## 2012-07-17 ENCOUNTER — Inpatient Hospital Stay (HOSPITAL_COMMUNITY)
Admission: AD | Admit: 2012-07-17 | Discharge: 2012-07-17 | Disposition: A | Discharge status: Home / Self Care | Payer: Self-pay | Source: Ambulatory Visit | Attending: Obstetrics & Gynecology | Admitting: Obstetrics & Gynecology

## 2012-07-17 DIAGNOSIS — Z711 Person with feared health complaint in whom no diagnosis is made: Secondary | ICD-10-CM | POA: Insufficient documentation

## 2012-07-17 DIAGNOSIS — N920 Excessive and frequent menstruation with regular cycle: Secondary | ICD-10-CM

## 2012-07-17 HISTORY — DX: Adverse effect of unspecified anesthetic, initial encounter: T41.45XA

## 2012-07-17 HISTORY — DX: Other complications of anesthesia, initial encounter: T88.59XA

## 2012-07-17 HISTORY — DX: Other specified health status: Z78.9

## 2012-07-17 LAB — URINALYSIS, ROUTINE W REFLEX MICROSCOPIC
Bilirubin Urine: NEGATIVE
Glucose, UA: NEGATIVE mg/dL
Leukocytes, UA: NEGATIVE
Nitrite: NEGATIVE
Specific Gravity, Urine: >1.03 — ABNORMAL HIGH (ref 1.005–1.030)
pH: 6 (ref 5.0–8.0)

## 2012-07-17 LAB — WET PREP, GENITAL
Trich, Wet Prep: NONE SEEN
Yeast Wet Prep HPF POC: NONE SEEN

## 2012-07-17 MED ORDER — IBUPROFEN 600 MG PO TABS: 600.0000 mg | ORAL_TABLET | Freq: Four times a day (QID) | ORAL | Status: AC | PRN

## 2012-07-17 NOTE — MAU Note (Signed)
States had a miscarriage back in June; positive pg test in August, then bleeding as a normal period. She did this with her son, so she was not concerned until this am she has had "heavy bleeding and cramping, a fluttering feeling all day"

## 2012-07-17 NOTE — ED Provider Notes (Signed)
History     CSN: [id number]  Arrival date and time: 07/17/12 1451   First Provider Initiated Contact with Patient 07/17/12 1616      Chief Complaint  Patient presents with  . Possible Pregnancy  . Vaginal Bleeding  . Abdominal Pain   HPI HPI: Frances [name] is a 21 y.o. [phone] with 1 d hx abd cramping, sweating and BRB heavier than a normal period. She was 1 wk late for menses and had +HPT 06/12/12, then had 2 days of light flow beginning 06/14/12 so thought she may be pregnant now. Menses q month light to moderate flow, no dysmenorrhea. No irritative vaginal discharge. No contraception.  Has been having nausea/vomiting on and off for the past 3 months. No CP, SOB, fever, chills, headache, vision changes, urinary/bowel symptoms.   OB History    Grav Para Term Preterm Abortions TAB SAB Ect Mult Living   4 2 0 0 2 1 1   2       Past Medical History  Diagnosis Date  . Complication of anesthesia     with csections, felt like she "couldnt swallow during the delivery of baby"  . No pertinent past medical history     preeclampsia with 2010 pregnancy    Past Surgical History  Procedure Date  . Cesarean section     History reviewed. No pertinent family history.  History  Substance Use Topics  . Smoking status: Former Smoker -- 0.2 packs/day for 4 years    Types: Cigarettes  . Smokeless tobacco: Never Used  . Alcohol Use: No    Allergies:  Allergies  Allergen Reactions  . Penicillins Hives    Prescriptions prior to admission  Medication Sig Dispense Refill  . acetaminophen (TYLENOL) 500 MG tablet Take 500 mg by mouth every 6 (six) hours as needed. For pain      . DISCONTD: ibuprofen (ADVIL,MOTRIN) 200 MG tablet Take 400 mg by mouth every 6 (six) hours as needed. For pain        ROS See HPI for pertinent ROS Physical Exam   Blood pressure 137/80, pulse 89, temperature 98.7 F (37.1 C), temperature source Oral, resp. rate 20, height 5' 7.5" (1.715 m), weight 318 lb  (144.244 kg), last menstrual period 05/20/2012.  Physical Exam  Constitutional: She appears well-developed.  HENT:  Head: Normocephalic and atraumatic.  Right Ear: External ear normal.  Cardiovascular: Normal rate, regular rhythm, S1 normal, S2 normal and intact distal pulses.  Exam reveals no gallop, no S3, no S4 and no friction rub.   No murmur heard. Respiratory: Effort normal and breath sounds normal. No respiratory distress. She has no wheezes. She has no rales. She exhibits no tenderness.  GI: Soft. Bowel sounds are normal. She exhibits no distension, no pulsatile liver, no abdominal bruit, no ascites and no mass. There is no hepatosplenomegaly. There is tenderness in the left upper quadrant and left lower quadrant. There is no rigidity, no rebound and no guarding.  Genitourinary: There is no rash, tenderness, lesion or injury on the right labia. There is no rash, tenderness, lesion or injury on the left labia. There is bleeding around the vagina. No erythema or tenderness around the vagina. No foreign body around the vagina. No signs of injury around the vagina. No vaginal discharge found.       No discharge or signs of trauma seen. Small amount of bright red blood in vaginal vault. No cervical motion tenderness. GC/Chlamidya and wet prep performed.  Exam  performed with Chaperone.  Moderate amt. menstrual-type blood in vaginal vault. No discharge seen  MAU Course  Procedures   Results for orders placed during the hospital encounter of 07/17/12 (from the past 24 hour(s))  URINALYSIS, ROUTINE W REFLEX MICROSCOPIC     Status: Abnormal   Collection Time   07/17/12  3:20 PM      Component Value Range   Color, Urine YELLOW  YELLOW   APPearance HAZY (*) CLEAR   Specific Gravity, Urine >1.030 (*) 1.005 - 1.030   pH 6.0  5.0 - 8.0   Glucose, UA NEGATIVE  NEGATIVE mg/dL   Hgb urine dipstick NEGATIVE  NEGATIVE   Bilirubin Urine NEGATIVE  NEGATIVE   Ketones, ur NEGATIVE  NEGATIVE mg/dL    Protein, ur NEGATIVE  NEGATIVE mg/dL   Urobilinogen, UA 0.2  0.0 - 1.0 mg/dL   Nitrite NEGATIVE  NEGATIVE   Leukocytes, UA NEGATIVE  NEGATIVE  POCT PREGNANCY, URINE     Status: Normal   Collection Time   07/17/12  3:28 PM      Component Value Range   Preg Test, Ur NEGATIVE  NEGATIVE  WET PREP, GENITAL     Status: Abnormal   Collection Time   07/17/12  4:49 PM      Component Value Range   Yeast Wet Prep HPF POC NONE SEEN  NONE SEEN   Trich, Wet Prep NONE SEEN  NONE SEEN   Clue Cells Wet Prep HPF POC FEW (*) NONE SEEN   WBC, Wet Prep HPF POC FEW (*) NONE SEEN    Assessment and Plan  Menstrual bleeding GC/Chlamydia + Wet Prep for possible infection Keep menstrual calendar.  F[facility]U [facility] if desiring contraception and GYN Clinic if menstrual irregularity persists.     Doran Heater 07/17/2012, 4:59 PM

## 2012-07-17 NOTE — MAU Note (Signed)
Hx of SAB in June.  08/02 had + preg test, a few days later had bleeding like a period ( was not alarmed as had done this with first pregnancy). This morning started having pain and bleeding.

## 2012-07-17 NOTE — MAU Provider Note (Signed)
Chief Complaint: Possible Pregnancy, Vaginal Bleeding and Abdominal Pain   First Provider Initiated Contact with Patient 07/17/12 1616     SUBJECTIVE HPI: Frances [name] is a 21 y.o. [id number] with 1 d hx abd cramping and BRB heavier than a normal period. She was 1 wk late for menses and had +HPT 06/12/12, then had 2 days of light flow beginning 06/14/12 so thought she may be pregnant now. Menses q month light to moderate flow, no dysmenorrhea.  No irritative vaginal discharge. No contraception  Past Medical History  Diagnosis Date  . Complication of anesthesia     with csections, felt like she "couldnt swallow during the delivery of baby"  . No pertinent past medical history     preeclampsia with 2010 pregnancy   OB History    Grav Para Term Preterm Abortions TAB SAB Ect Mult Living   3 2   1 1    2      # Outc Date GA Lbr Len/2nd Wgt Sex Del Anes PTL Lv   1 TAB 2005           2 PAR 2010     CS      3 PAR 2011     CS        Past Surgical History  Procedure Date  . Cesarean section    History   Social History  . Marital Status: Single    Spouse Name: N/A    Number of Children: N/A  . Years of Education: N/A   Occupational History  . Not on file.   Social History Main Topics  . Smoking status: Former Smoker -- 0.2 packs/day for 4 years    Types: Cigarettes  . Smokeless tobacco: Never Used  . Alcohol Use: No  . Drug Use: No  . Sexually Active: Yes    Birth Control/ Protection: None   Other Topics Concern  . Not on file   Social History Narrative  . No narrative on file   No current facility-administered medications on file prior to encounter.   No current outpatient prescriptions on file prior to encounter.   Allergies  Allergen Reactions  . Penicillins Hives    ROS: Pertinent items in HPI  OBJECTIVE Blood pressure 137/80, pulse 89, temperature 98.7 F (37.1 C), temperature source Oral, resp. rate 20, height 5' 7.5" (1.715 m), weight 144.244 kg (318 lb),  last menstrual period 05/20/2012.  See today's  note by PA student for Physical Exam - significant for menstrual-type bleeding only LAB RESULTS Results for orders placed during the hospital encounter of 07/17/12 (from the past 24 hour(s))  URINALYSIS, ROUTINE W REFLEX MICROSCOPIC     Status: Abnormal   Collection Time   07/17/12  3:20 PM      Component Value Range   Color, Urine YELLOW  YELLOW   APPearance HAZY (*) CLEAR   Specific Gravity, Urine >1.030 (*) 1.005 - 1.030   pH 6.0  5.0 - 8.0   Glucose, UA NEGATIVE  NEGATIVE mg/dL   Hgb urine dipstick NEGATIVE  NEGATIVE   Bilirubin Urine NEGATIVE  NEGATIVE   Ketones, ur NEGATIVE  NEGATIVE mg/dL   Protein, ur NEGATIVE  NEGATIVE mg/dL   Urobilinogen, UA 0.2  0.0 - 1.0 mg/dL   Nitrite NEGATIVE  NEGATIVE   Leukocytes, UA NEGATIVE  NEGATIVE  POCT PREGNANCY, URINE     Status: Normal   Collection Time   07/17/12  3:28 PM      Component Value  Range   Preg Test, Ur NEGATIVE  NEGATIVE       ASSESSMENT Menstrual bleeding  PLAN Discharge home Medication List  As of 07/17/2012  6:01 PM   TAKE these medications         acetaminophen 500 MG tablet   Commonly known as: TYLENOL   Take 500 mg by mouth every 6 (six) hours as needed. For pain      ibuprofen 600 MG tablet   Commonly known as: ADVIL,MOTRIN   Take 1 tablet (600 mg total) by mouth every 6 (six) hours as needed for pain.          Keep menstrual calendar. F/U GYN clinic prn and [facility] if desiring contraception   Danae Orleans, CNM 07/17/2012  4:16 PM

## 2012-07-20 NOTE — MAU Provider Note (Signed)
Medical Screening exam and patient care preformed by advanced practice provider.  Agree with the above management.  

## 2012-07-20 NOTE — ED Provider Notes (Signed)
Medical Screening exam and patient care preformed by advanced practice provider.  Agree with the above management.  

## 2012-11-21 ENCOUNTER — Encounter (HOSPITAL_COMMUNITY): Payer: Self-pay

## 2012-11-21 ENCOUNTER — Inpatient Hospital Stay (HOSPITAL_COMMUNITY): Payer: Self-pay

## 2012-11-21 ENCOUNTER — Inpatient Hospital Stay (HOSPITAL_COMMUNITY)
Admission: AD | Admit: 2012-11-21 | Discharge: 2012-11-22 | Disposition: A | Discharge status: Home / Self Care | Payer: Self-pay | Source: Ambulatory Visit | Attending: Obstetrics & Gynecology | Admitting: Obstetrics & Gynecology

## 2012-11-21 DIAGNOSIS — N760 Acute vaginitis: Secondary | ICD-10-CM | POA: Insufficient documentation

## 2012-11-21 DIAGNOSIS — R109 Unspecified abdominal pain: Secondary | ICD-10-CM | POA: Insufficient documentation

## 2012-11-21 DIAGNOSIS — O26899 Other specified pregnancy related conditions, unspecified trimester: Secondary | ICD-10-CM

## 2012-11-21 DIAGNOSIS — B9689 Other specified bacterial agents as the cause of diseases classified elsewhere: Secondary | ICD-10-CM | POA: Insufficient documentation

## 2012-11-21 DIAGNOSIS — A499 Bacterial infection, unspecified: Secondary | ICD-10-CM | POA: Insufficient documentation

## 2012-11-21 DIAGNOSIS — O2390 Unspecified genitourinary tract infection in pregnancy, unspecified trimester: Secondary | ICD-10-CM | POA: Insufficient documentation

## 2012-11-21 LAB — ABO/RH: ABO/RH(D): A POS

## 2012-11-21 LAB — URINALYSIS, ROUTINE W REFLEX MICROSCOPIC
Bilirubin Urine: NEGATIVE
Glucose, UA: NEGATIVE mg/dL
Hgb urine dipstick: NEGATIVE
Ketones, ur: NEGATIVE mg/dL
Leukocytes, UA: NEGATIVE
Nitrite: NEGATIVE
Protein, ur: NEGATIVE mg/dL
Specific Gravity, Urine: 1.025 (ref 1.005–1.030)
Urobilinogen, UA: 0.2 mg/dL (ref 0.0–1.0)
pH: 6 (ref 5.0–8.0)

## 2012-11-21 LAB — WET PREP, GENITAL
Trich, Wet Prep: NONE SEEN
Yeast Wet Prep HPF POC: NONE SEEN

## 2012-11-21 LAB — CBC
HCT: 34.9 % — ABNORMAL LOW (ref 36.0–46.0)
Hemoglobin: 11.9 g/dL — ABNORMAL LOW (ref 12.0–15.0)
MCH: 30 pg (ref 26.0–34.0)
MCHC: 34.1 g/dL (ref 30.0–36.0)
MCV: 87.9 fL (ref 78.0–100.0)
Platelets: 241 K/uL (ref 150–400)
RBC: 3.97 MIL/uL (ref 3.87–5.11)
RDW: 13.3 % (ref 11.5–15.5)
WBC: 12.1 K/uL — ABNORMAL HIGH (ref 4.0–10.5)

## 2012-11-21 LAB — POCT PREGNANCY, URINE: Preg Test, Ur: POSITIVE — AB

## 2012-11-21 LAB — HCG, QUANTITATIVE, PREGNANCY: hCG, Beta Chain, Quant, S: 17608 m[IU]/mL — ABNORMAL HIGH

## 2012-11-21 MED ORDER — METRONIDAZOLE 500 MG PO TABS: 500.0000 mg | ORAL_TABLET | Freq: Two times a day (BID) | ORAL | Status: DC

## 2012-11-21 NOTE — MAU Note (Signed)
Took upt at home Monday and was positive. Having sharp pain down vaginal canal yesterday and today. R side of stomach is sore and have pain on R side sometimes. Saw blood on tissue tonight when wiped.

## 2012-11-21 NOTE — MAU Provider Note (Signed)
History     CSN: [id number]  Arrival date and time: 11/21/12 2219   First Provider Initiated Contact with Patient 11/21/12 2253      Chief Complaint  Patient presents with  . Abdominal Pain  . Vaginal Bleeding   HPI  Positive pregnancy test at home.  Sharp pain down vaginal canal yesterday and today and also reports pain on right side of stomach.  +blood on tissue when wiping.    Past Medical History  Diagnosis Date  . Complication of anesthesia     with csections, felt like she "couldnt swallow during the delivery of baby"  . No pertinent past medical history     preeclampsia with 2010 pregnancy    Past Surgical History  Procedure Date  . Cesarean section     History reviewed. No pertinent family history.  History  Substance Use Topics  . Smoking status: Former Smoker -- 0.2 packs/day for 4 years    Types: Cigarettes  . Smokeless tobacco: Never Used  . Alcohol Use: No    Allergies:  Allergies  Allergen Reactions  . Penicillins Hives    Prescriptions prior to admission  Medication Sig Dispense Refill  . acetaminophen (TYLENOL) 500 MG tablet Take 500 mg by mouth every 6 (six) hours as needed. For pain        Review of Systems  Constitutional: Negative for fever.  Gastrointestinal: Positive for nausea and abdominal pain (pelvic pain). Negative for vomiting.  Musculoskeletal:       Vaginal pain; spotting of blood on tissue  All other systems reviewed and are negative.   Physical Exam   Blood pressure 141/78, pulse 78, temperature 98.2 F (36.8 C), resp. rate 20, height 5\' 9"  (1.753 m), weight 141.976 kg (313 lb), last menstrual period 10/08/2012, SpO2 100.00%.  Physical Exam  Constitutional: She is oriented to person, place, and time. She appears well-developed and well-nourished. No distress.       Obese  HENT:  Head: Normocephalic.  Neck: Normal range of motion. Neck supple.  Cardiovascular: Normal rate, regular rhythm and normal heart sounds.     Respiratory: Effort normal and breath sounds normal. No respiratory distress.  GI: Soft. She exhibits no mass. There is no tenderness. There is no rebound, no guarding and no CVA tenderness.  Genitourinary: Uterus is enlarged. Cervix exhibits no motion tenderness and no discharge. Right adnexum displays no mass and no tenderness. Left adnexum displays no mass and no tenderness. Vaginal discharge (white, creamy) found.  Musculoskeletal: Normal range of motion.  Neurological: She is alert and oriented to person, place, and time.  Skin: Skin is warm and dry.  Psychiatric: She has a normal mood and affect.    MAU Course  Procedures Ultrasound: IMPRESSION:  1. Single intrauterine gestation with embryo and normal cardiac  activity.  2. Estimate gestational age by crown-rump length equals 6 weeks 1  day.  Results for orders placed during the hospital encounter of 11/21/12 (from the past 24 hour(s))  URINALYSIS, ROUTINE W REFLEX MICROSCOPIC     Status: Normal   Collection Time   11/21/12 10:35 PM      Component Value Range   Color, Urine YELLOW  YELLOW   APPearance CLEAR  CLEAR   Specific Gravity, Urine 1.025  1.005 - 1.030   pH 6.0  5.0 - 8.0   Glucose, UA NEGATIVE  NEGATIVE mg/dL   Hgb urine dipstick NEGATIVE  NEGATIVE   Bilirubin Urine NEGATIVE  NEGATIVE   Ketones, ur  NEGATIVE  NEGATIVE mg/dL   Protein, ur NEGATIVE  NEGATIVE mg/dL   Urobilinogen, UA 0.2  0.0 - 1.0 mg/dL   Nitrite NEGATIVE  NEGATIVE   Leukocytes, UA NEGATIVE  NEGATIVE  POCT PREGNANCY, URINE     Status: Abnormal   Collection Time   11/21/12 10:37 PM      Component Value Range   Preg Test, Ur POSITIVE (*) NEGATIVE  HCG, QUANTITATIVE, PREGNANCY     Status: Abnormal   Collection Time   11/21/12 10:51 PM      Component Value Range   hCG, Beta Chain, Sharene Butters, [place] [address] (*) <5 mIU/mL  CBC     Status: Abnormal   Collection Time   11/21/12 10:51 PM      Component Value Range   WBC 12.1 (*) 4.0 - 10.5 K/uL   RBC 3.97   3.87 - 5.11 MIL/uL   Hemoglobin 11.9 (*) 12.0 - 15.0 g/dL   HCT 60.4 (*) 54.0 - 98.1 %   MCV 87.9  78.0 - 100.0 fL   MCH 30.0  26.0 - 34.0 pg   MCHC 34.1  30.0 - 36.0 g/dL   RDW 19.1  47.8 - 29.5 %   Platelets 241  150 - 400 K/uL  ABO/RH     Status: Normal   Collection Time   11/21/12 10:51 PM      Component Value Range   ABO/RH(D) A POS    WET PREP, GENITAL     Status: Abnormal   Collection Time   11/21/12 11:05 PM      Component Value Range   Yeast Wet Prep HPF POC NONE SEEN  NONE SEEN   Trich, Wet Prep NONE SEEN  NONE SEEN   Clue Cells Wet Prep HPF POC FEW (*) NONE SEEN   WBC, Wet Prep HPF POC FEW (*) NONE SEEN     Assessment and Plan  6.2 wk IUP Bacterial Vaginosis  Plan: DC to home RX Flagyl Begin prenatal care.  Detar North 11/21/2012, 10:54 PM

## 2012-11-22 NOTE — MAU Provider Note (Signed)
Attestation of Attending Supervision of Advanced Practitioner (PA/CNM/NP): Evaluation and management procedures were performed by the Advanced Practitioner under my supervision and collaboration.  I have reviewed the Advanced Practitioner's note and chart, and I agree with the management and plan.  Salima Rumer, MD, FACOG Attending Obstetrician & Gynecologist Faculty Practice, [facility]  

## 2012-11-23 LAB — GC/CHLAMYDIA PROBE AMP
CT Probe RNA: NEGATIVE
GC Probe RNA: NEGATIVE

## 2012-12-30 ENCOUNTER — Encounter (HOSPITAL_COMMUNITY): Payer: Self-pay | Admitting: *Deleted

## 2012-12-30 ENCOUNTER — Inpatient Hospital Stay (HOSPITAL_COMMUNITY)
Admission: AD | Admit: 2012-12-30 | Discharge: 2012-12-30 | Disposition: A | Discharge status: Home / Self Care | Payer: Medicaid Other | Source: Ambulatory Visit | Attending: Obstetrics & Gynecology | Admitting: Obstetrics & Gynecology

## 2012-12-30 DIAGNOSIS — O9989 Other specified diseases and conditions complicating pregnancy, childbirth and the puerperium: Secondary | ICD-10-CM

## 2012-12-30 DIAGNOSIS — M549 Dorsalgia, unspecified: Secondary | ICD-10-CM

## 2012-12-30 DIAGNOSIS — M545 Low back pain, unspecified: Secondary | ICD-10-CM | POA: Insufficient documentation

## 2012-12-30 DIAGNOSIS — I959 Hypotension, unspecified: Secondary | ICD-10-CM | POA: Insufficient documentation

## 2012-12-30 DIAGNOSIS — O99891 Other specified diseases and conditions complicating pregnancy: Secondary | ICD-10-CM | POA: Insufficient documentation

## 2012-12-30 HISTORY — DX: Gestational (pregnancy-induced) hypertension without significant proteinuria, unspecified trimester: O13.9

## 2012-12-30 HISTORY — DX: Cardiomegaly: I51.7

## 2012-12-30 LAB — CBC
MCHC: 32.9 g/dL (ref 30.0–36.0)
Platelets: 253 10*3/uL (ref 150–400)
RDW: 13.4 % (ref 11.5–15.5)
WBC: 11 10*3/uL — ABNORMAL HIGH (ref 4.0–10.5)

## 2012-12-30 LAB — COMPREHENSIVE METABOLIC PANEL
AST: 18 U/L (ref 0–37)
Albumin: 3.1 g/dL — ABNORMAL LOW (ref 3.5–5.2)
Alkaline Phosphatase: 64 U/L (ref 39–117)
Chloride: 104 mEq/L (ref 96–112)
Creatinine, Ser: 0.76 mg/dL (ref 0.50–1.10)
Potassium: 3.9 mEq/L (ref 3.5–5.1)
Total Bilirubin: 0.2 mg/dL — ABNORMAL LOW (ref 0.3–1.2)
Total Protein: 6.9 g/dL (ref 6.0–8.3)

## 2012-12-30 LAB — URINALYSIS, ROUTINE W REFLEX MICROSCOPIC
Ketones, ur: NEGATIVE mg/dL
Leukocytes, UA: NEGATIVE
Protein, ur: NEGATIVE mg/dL
Urobilinogen, UA: 0.2 mg/dL (ref 0.0–1.0)

## 2012-12-30 MED ORDER — OXYCODONE-ACETAMINOPHEN 5-325 MG PO TABS: 2.0000 | ORAL_TABLET | ORAL | Status: DC | PRN

## 2012-12-30 MED ORDER — CYCLOBENZAPRINE HCL 10 MG PO TABS: 10.0000 mg | ORAL_TABLET | Freq: Two times a day (BID) | ORAL | Status: DC | PRN

## 2012-12-30 MED ORDER — OXYCODONE-ACETAMINOPHEN 5-325 MG PO TABS
1.0000 | ORAL_TABLET | Freq: Once | ORAL | Status: AC
  Administered 2012-12-30: 1 via ORAL
  Filled 2012-12-30: qty 1

## 2012-12-30 NOTE — MAU Note (Signed)
Back pain in Pregnancy worksheet given to pt

## 2012-12-30 NOTE — MAU Provider Note (Signed)
History     CSN: [id number]  Arrival date and time: 12/30/12 1137   First Provider Initiated Contact with Patient 12/30/12 1317      Chief Complaint  Patient presents with  . Back Pain   HPI Ms. Ishana I Puerta is a 22 y.o. [id number] at [redacted]w[redacted]d who presents to MAU today with complaint of low back pain. The patient states that this started about 1 week ago but has become much worse overnight. She states that it feels like it is tightening and balling up on the lower right side. She fell 3 nights ago on her right hip and feels that the pain has really been worse since then. She has taken tylenol which helped somewhat. She denies any radiation of the pain, numbness or tingling in her extremities. She rates that pain at 5-6/10 now.   The patient also states that she has had a few episodes of increased heart rate and SOB over the last few days. She states that these episodes come on when she has been sitting and last 2-4 minutes. She is able to breath and relax herself and they will stop on their own. The patient states that the only other time she has experienced something like this was when she had pre-eclampsia after her last pregnancy. This has only happened once today and seems to be less frequent today.   The patient has had headache off and on throughout the pregnancy. She has had some nausea without vomiting. She denies vaginal bleeding, abdominal pain, abnormal discharge, LOF, UTI symptoms or fever. She plans to go to [facility] for prenatal care when she gets her Medicaid card in the mail. She was told she has been approved.   OB History   Grav Para Term Preterm Abortions TAB SAB Ect Mult Living   5 2 0 0 2 1 1   2       Past Medical History  Diagnosis Date  . Complication of anesthesia     with csections, felt like she "couldnt swallow during the delivery of baby"  . No pertinent past medical history     preeclampsia with 2010 pregnancy  . Pregnancy induced hypertension   .  Ventricular hypertrophy     Past Surgical History  Procedure Laterality Date  . Cesarean section      Family History  Problem Relation Age of Onset  . Stroke Maternal Uncle   . Diabetes Maternal Uncle   . Diabetes Paternal Grandmother     History  Substance Use Topics  . Smoking status: Former Smoker -- 0.25 packs/day for 4 years    Types: Cigarettes  . Smokeless tobacco: Never Used  . Alcohol Use: No    Allergies:  Allergies  Allergen Reactions  . Penicillins Hives    No prescriptions prior to admission    Review of Systems  Constitutional: Negative for fever, chills and malaise/fatigue.  HENT: Negative for neck pain.   Respiratory: Positive for shortness of breath (not currently). Negative for wheezing.   Cardiovascular: Negative for chest pain.  Gastrointestinal: Positive for nausea. Negative for vomiting and abdominal pain.  Genitourinary: Negative for dysuria, urgency and frequency.       Neg - vaginal bleeding Neg - vaginal discharge   Musculoskeletal: Positive for back pain.  Neurological: Positive for headaches. Negative for dizziness, tingling, sensory change, focal weakness and loss of consciousness.   Physical Exam   Blood pressure 105/58, pulse 85, temperature 98.4 F (36.9 C), temperature source Oral, resp. rate  20, height 5' 9.5" (1.765 m), weight 315 lb 9.6 oz (143.155 kg), last menstrual period 10/08/2012, SpO2 99.00%.  Physical Exam  Constitutional: She is oriented to person, place, and time.  Well-developed obese female in no acute distress. Appears uncomfortable when laying flat on her back. Difficulty and slow to change positions.   HENT:  Head: Normocephalic and atraumatic.  Neck: Normal range of motion.  Cardiovascular: Normal rate, regular rhythm and normal heart sounds.   Respiratory: Effort normal and breath sounds normal. No respiratory distress.  GI: Soft. Bowel sounds are normal. She exhibits no distension and no mass. There is  tenderness (RUQ tenderness to palpation). There is no rebound and no guarding.  Genitourinary:  Cervix closed, long, thick  Musculoskeletal: Normal range of motion. She exhibits tenderness (right lower back tenderness to palpation. Not SI joint area). She exhibits no edema.  Neurological: She is alert and oriented to person, place, and time.  Skin: Skin is warm and dry. No erythema.  Psychiatric: She has a normal mood and affect.   FHT - 168 bpm  Results for orders placed during the hospital encounter of 12/30/12 (from the past 24 hour(s))  URINALYSIS, ROUTINE W REFLEX MICROSCOPIC     Status: None   Collection Time    12/30/12 12:37 PM      Result Value Range   Color, Urine YELLOW  YELLOW   APPearance CLEAR  CLEAR   Specific Gravity, Urine 1.025  1.005 - 1.030   pH 6.0  5.0 - 8.0   Glucose, UA NEGATIVE  NEGATIVE mg/dL   Hgb urine dipstick NEGATIVE  NEGATIVE   Bilirubin Urine NEGATIVE  NEGATIVE   Ketones, ur NEGATIVE  NEGATIVE mg/dL   Protein, ur NEGATIVE  NEGATIVE mg/dL   Urobilinogen, UA 0.2  0.0 - 1.0 mg/dL   Nitrite NEGATIVE  NEGATIVE   Leukocytes, UA NEGATIVE  NEGATIVE  CBC     Status: Abnormal   Collection Time    12/30/12  1:54 PM      Result Value Range   WBC 11.0 (*) 4.0 - 10.5 K/uL   RBC 3.85 (*) 3.87 - 5.11 MIL/uL   Hemoglobin 11.2 (*) 12.0 - 15.0 g/dL   HCT 95.6 (*) 21.3 - 08.6 %   MCV 88.3  78.0 - 100.0 fL   MCH 29.1  26.0 - 34.0 pg   MCHC 32.9  30.0 - 36.0 g/dL   RDW 57.8  46.9 - 62.9 %   Platelets 253  150 - 400 K/uL  COMPREHENSIVE METABOLIC PANEL     Status: Abnormal   Collection Time    12/30/12  1:54 PM      Result Value Range   Sodium 136  135 - 145 mEq/L   Potassium 3.9  3.5 - 5.1 mEq/L   Chloride 104  96 - 112 mEq/L   CO2 24  19 - 32 mEq/L   Glucose, Bld 94  70 - 99 mg/dL   BUN 8  6 - 23 mg/dL   Creatinine, Ser 5.28  0.50 - 1.10 mg/dL   Calcium 8.8  8.4 - 41.3 mg/dL   Total Protein 6.9  6.0 - 8.3 g/dL   Albumin 3.1 (*) 3.5 - 5.2 g/dL   AST 18   0 - 37 U/L   ALT 14  0 - 35 U/L   Alkaline Phosphatase 64  39 - 117 U/L   Total Bilirubin 0.2 (*) 0.3 - 1.2 mg/dL   GFR calc non Af Amer >  90  >90 mL/min   GFR calc Af Amer >90  >90 mL/min    MAU Course  Procedures  MDM Percocet helped back pain Discussed patient with Dr. Debroah Loop. He recommends CBC and CMP today because of history.  CBC, CMP labs normal. Second BP much improved.   Assessment and Plan  A: Back pain in pregnancy Hypotension in pregnancy  P: Discharge home Rx for Percocet given to patient Rx for Flexeril sent to patient's pharmacy Patient encouraged to rest and use ice periodically for back pain Patient encouraged to start prenatal care with [facility] as planned as soon as possible Patient may return to MAU as needed  Freddi Starr, PA-C  12/30/2012, 3:26 PM

## 2012-12-30 NOTE — Progress Notes (Signed)
Written and verbal d/c instructions given and understanding voiced. 

## 2012-12-30 NOTE — MAU Note (Signed)
Patient states she has had low back pain for about one week, worse since this am. Has periods of feeling heart racing that lasts for about 3 minutes. Has had a headache on and off for about one month.

## 2012-12-30 NOTE — Progress Notes (Signed)
States fell few nights ago when getting out of shower. Hit L side. NO bleeding since fall

## 2013-02-04 ENCOUNTER — Encounter: Payer: Self-pay | Admitting: Obstetrics

## 2013-02-08 ENCOUNTER — Encounter: Payer: Self-pay | Admitting: Obstetrics

## 2013-02-08 ENCOUNTER — Ambulatory Visit (INDEPENDENT_AMBULATORY_CARE_PROVIDER_SITE_OTHER): Payer: Medicaid Other | Admitting: Obstetrics

## 2013-02-08 VITALS — BP 136/90 | Temp 97.3°F | Wt 309.0 lb

## 2013-02-08 DIAGNOSIS — N760 Acute vaginitis: Secondary | ICD-10-CM

## 2013-02-08 DIAGNOSIS — Z3480 Encounter for supervision of other normal pregnancy, unspecified trimester: Secondary | ICD-10-CM

## 2013-02-08 DIAGNOSIS — Z3201 Encounter for pregnancy test, result positive: Secondary | ICD-10-CM

## 2013-02-08 LAB — POCT URINALYSIS DIPSTICK
Bilirubin, UA: NEGATIVE
Glucose, UA: NEGATIVE
Leukocytes, UA: NEGATIVE
Nitrite, UA: NEGATIVE
Urobilinogen, UA: NEGATIVE

## 2013-02-08 LAB — RPR

## 2013-02-08 LAB — HIV ANTIBODY (ROUTINE TESTING W REFLEX): HIV: NONREACTIVE

## 2013-02-08 MED ORDER — OB COMPLETE PETITE 35-5-1-200 MG PO CAPS: 1.0000 | ORAL_CAPSULE | Freq: Every day | ORAL | Status: DC

## 2013-02-08 MED ORDER — BUTALBITAL-APAP-CAFF-COD 50-325-40-30 MG PO CAPS: 1.0000 | ORAL_CAPSULE | ORAL | Status: DC | PRN

## 2013-02-08 NOTE — Addendum Note (Signed)
Addended by: Julaine Hua on: 02/08/2013 04:53 PM   Modules accepted: Orders

## 2013-02-08 NOTE — Progress Notes (Signed)
No complaints. Doing well.

## 2013-02-08 NOTE — Progress Notes (Signed)
Pulse: 81

## 2013-02-08 NOTE — Addendum Note (Signed)
Addended by: Coral Ceo A on: 02/08/2013 12:11 PM   Modules accepted: Level of Service

## 2013-02-09 ENCOUNTER — Encounter (HOSPITAL_COMMUNITY): Payer: Self-pay | Admitting: *Deleted

## 2013-02-09 ENCOUNTER — Inpatient Hospital Stay (HOSPITAL_COMMUNITY)
Admission: AD | Admit: 2013-02-09 | Discharge: 2013-02-09 | Disposition: A | Discharge status: Home / Self Care | Payer: Medicaid Other | Source: Ambulatory Visit | Attending: Obstetrics | Admitting: Obstetrics

## 2013-02-09 DIAGNOSIS — R51 Headache: Secondary | ICD-10-CM | POA: Insufficient documentation

## 2013-02-09 DIAGNOSIS — O26892 Other specified pregnancy related conditions, second trimester: Secondary | ICD-10-CM

## 2013-02-09 DIAGNOSIS — O99891 Other specified diseases and conditions complicating pregnancy: Secondary | ICD-10-CM | POA: Insufficient documentation

## 2013-02-09 DIAGNOSIS — Y929 Unspecified place or not applicable: Secondary | ICD-10-CM | POA: Insufficient documentation

## 2013-02-09 DIAGNOSIS — R109 Unspecified abdominal pain: Secondary | ICD-10-CM | POA: Insufficient documentation

## 2013-02-09 DIAGNOSIS — O26899 Other specified pregnancy related conditions, unspecified trimester: Secondary | ICD-10-CM

## 2013-02-09 LAB — AFP, QUAD SCREEN
AFP: 21.6 IU/mL
Age Alone: 1:1100 {titer}
Down Syndrome Scr Risk Est: <1:38500 {titer}
HCG, Total: 9593 m[IU]/mL
INH: 70.2 pg/mL
Interpretation-AFP: NEGATIVE
MoM for INH: 0.6
Open Spina bifida: NEGATIVE
Osb Risk: 1:32200 {titer}
uE3 Mom: 0.84

## 2013-02-09 LAB — RUBELLA SCREEN: Rubella: 5.91 Index — ABNORMAL HIGH (ref <0.90)

## 2013-02-09 LAB — URINE CULTURE: Colony Count: 7000

## 2013-02-09 LAB — WET PREP BY MOLECULAR PROBE: Gardnerella vaginalis: POSITIVE — AB

## 2013-02-09 MED ORDER — BUTALBITAL-APAP-CAFFEINE 50-325-40 MG PO TABS
2.0000 | ORAL_TABLET | Freq: Once | ORAL | Status: DC
  Filled 2013-02-09: qty 2
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
## 2013-02-09 NOTE — MAU Note (Signed)
States has been having HA's and yesterday was prescribed Fioricet by MD. States she was unable to get it because her insurance will not pay for it.
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
## 2013-02-09 NOTE — Progress Notes (Signed)
Pt states she has had headaches for about a month.

## 2013-02-09 NOTE — Progress Notes (Signed)
Pt states she is sensitve to light

## 2013-02-10 LAB — PAP IG W/ RFLX HPV ASCU

## 2013-02-11 LAB — HUMAN PAPILLOMAVIRUS, HIGH RISK: HPV DNA High Risk: NOT DETECTED

## 2013-02-17 ENCOUNTER — Encounter (HOSPITAL_COMMUNITY): Payer: Self-pay

## 2013-02-17 ENCOUNTER — Inpatient Hospital Stay (HOSPITAL_COMMUNITY): Payer: Medicaid Other

## 2013-02-17 ENCOUNTER — Inpatient Hospital Stay (HOSPITAL_COMMUNITY)
Admission: AD | Admit: 2013-02-17 | Discharge: 2013-02-17 | Disposition: A | Discharge status: Home / Self Care | Payer: Medicaid Other | Source: Ambulatory Visit | Attending: Obstetrics & Gynecology | Admitting: Obstetrics & Gynecology

## 2013-02-17 DIAGNOSIS — M25569 Pain in unspecified knee: Secondary | ICD-10-CM

## 2013-02-17 DIAGNOSIS — M25561 Pain in right knee: Secondary | ICD-10-CM

## 2013-02-17 DIAGNOSIS — O99891 Other specified diseases and conditions complicating pregnancy: Secondary | ICD-10-CM | POA: Insufficient documentation

## 2013-02-17 DIAGNOSIS — M79609 Pain in unspecified limb: Secondary | ICD-10-CM | POA: Insufficient documentation

## 2013-02-17 HISTORY — DX: Headache: R51

## 2013-02-17 NOTE — MAU Note (Signed)
Pt here for swollen sore knee, states rolled onto her leg when she had an upset stomach Monday night. Has been sore ever since.

## 2013-02-17 NOTE — MAU Provider Note (Signed)
History     CSN: [id number]  Arrival date and time: 02/17/13 4098   First Provider Initiated Contact with Patient 02/17/13 1058      Chief Complaint  Patient presents with  . Leg Pain   HPI Ms. Frances [name] is a 22 y.o. [id number] at [redacted]w[redacted]d who presents to MAU today with complaint of right knee pain. The patient states that she rolled out of bed on Monday because she was not feeling well and landed on her knee. It did not hurt much at the time, but the next morning she woke up with a lot of pain. She tried tylenol and heat yesterday with out much relief. She is having trouble bearing weight today. She does not have pain when resting, but has 10/10 pain with ambulation. She feels like there may be some swelling, but not much. There is no bruising. She is having difficulty straightening her leg completely. She denies any obstetrical concerns today.   OB History   Grav Para Term Preterm Abortions TAB SAB Ect Mult Living   5 2 0 0 2 1 1   2       Past Medical History  Diagnosis Date  . Complication of anesthesia     with csections, felt like she "couldnt swallow during the delivery of baby"  . No pertinent past medical history     preeclampsia with 2010 pregnancy  . Pregnancy induced hypertension   . Ventricular hypertrophy   . History of pre-eclampsia in prior pregnancy, currently pregnant   . Headache     Past Surgical History  Procedure Laterality Date  . Cesarean section      Family History  Problem Relation Age of Onset  . Stroke Maternal Uncle   . Diabetes Maternal Uncle   . Diabetes Paternal Grandmother     History  Substance Use Topics  . Smoking status: Former Smoker -- 0.25 packs/day for 4 years    Types: Cigarettes    Quit date: 12/14/2012  . Smokeless tobacco: Never Used  . Alcohol Use: No    Allergies:  Allergies  Allergen Reactions  . Penicillins Hives    Prescriptions prior to admission  Medication Sig Dispense Refill  . acetaminophen (TYLENOL)  500 MG tablet Take 500 mg by mouth every 6 (six) hours as needed for pain.      . Prenatal Vit-Fe Fumarate-FA (PRENATAL MULTIVITAMIN) TABS Take 1 tablet by mouth daily at 12 noon.      . butalbital-acetaminophen-caffeine (FIORICET/CODEINE) 50-325-40-30 MG per capsule Take 1 capsule by mouth every 4 (four) hours as needed for headache.  40 capsule  2    Review of Systems  Constitutional: Negative for fever.  Gastrointestinal: Negative for nausea, vomiting and abdominal pain.  Genitourinary:       Neg - vaginal bleeding, discharge   Physical Exam   Blood pressure 123/77, pulse 83, temperature 98.4 F (36.9 C), temperature source Oral, height 5' 8.5" (1.74 m), weight 305 lb 6.4 oz (138.529 kg), last menstrual period 10/08/2012, SpO2 97.00%.  Physical Exam  Constitutional: She is oriented to person, place, and time. She appears well-developed and well-nourished. No distress.  HENT:  Head: Normocephalic and atraumatic.  Cardiovascular: Normal rate, regular rhythm and normal heart sounds.   Respiratory: Effort normal and breath sounds normal. No respiratory distress.  GI: Soft. Bowel sounds are normal. She exhibits no distension and no mass. There is no tenderness. There is no rebound and no guarding.  Musculoskeletal: She exhibits edema and  tenderness.       Right knee: She exhibits decreased range of motion, swelling and bony tenderness. She exhibits no effusion, no ecchymosis, no deformity, no laceration, no erythema, no LCL laxity, normal patellar mobility, normal meniscus and no MCL laxity. Tenderness found. Medial joint line and lateral joint line tenderness noted. No MCL, no LCL and no patellar tendon tenderness noted.  Neurological: She is alert and oriented to person, place, and time.  Skin: Skin is warm and dry. No erythema.  Psychiatric: She has a normal mood and affect.   Dg Knee Complete 4 Views Right  02/17/2013  *RADIOLOGY REPORT*  Clinical Data: Recent fall from bed on  02/15/2013  RIGHT KNEE - COMPLETE 4+ VIEW  Comparison: None.  Findings: No acute fracture or dislocation is noted.  No soft tissue abnormality is noted.  The patient was shielded for this exam given her pregnant state.  IMPRESSION: No acute abnormality noted.   Original Report Authenticated By: Alcide Clever, M.D.      MAU Course  Procedures None  MDM Xray of knee to ensure to bone fracture of patella or otherwise.   Assessment and Plan  A: Right knee pain, trauma; most likely pre-patellar bursitis  P: Discharge home Instructed to rest, ice, elevate 15-20 minutes at a time 3-4 times a day Follow-up with Dr. Tamela Oddi as scheduled for regular prenatal care Follow-up with Ortho if pain persists Patient may return to MAU as needed or if her condition were to change or worsen  Freddi Starr, PA-C  02/17/2013, 11:25 AM

## 2013-02-17 NOTE — MAU Note (Signed)
Patient states that when she woke up yesterday her right knee was painful and it continued through the day. Again this am was hard to put weight on it and hard to walk. Denies bleeding or discharge and has an occasional abdominal pain, none today.

## 2013-03-10 ENCOUNTER — Encounter: Payer: Self-pay | Admitting: Obstetrics

## 2013-03-10 ENCOUNTER — Ambulatory Visit (INDEPENDENT_AMBULATORY_CARE_PROVIDER_SITE_OTHER): Payer: Medicaid Other

## 2013-03-10 ENCOUNTER — Ambulatory Visit (INDEPENDENT_AMBULATORY_CARE_PROVIDER_SITE_OTHER): Payer: Medicaid Other | Admitting: Obstetrics

## 2013-03-10 VITALS — BP 134/71 | Temp 97.5°F | Wt 307.0 lb

## 2013-03-10 DIAGNOSIS — N390 Urinary tract infection, site not specified: Secondary | ICD-10-CM

## 2013-03-10 DIAGNOSIS — Z363 Encounter for antenatal screening for malformations: Secondary | ICD-10-CM

## 2013-03-10 DIAGNOSIS — Z3480 Encounter for supervision of other normal pregnancy, unspecified trimester: Secondary | ICD-10-CM

## 2013-03-10 DIAGNOSIS — Z3482 Encounter for supervision of other normal pregnancy, second trimester: Secondary | ICD-10-CM

## 2013-03-10 DIAGNOSIS — O0990 Supervision of high risk pregnancy, unspecified, unspecified trimester: Secondary | ICD-10-CM

## 2013-03-10 DIAGNOSIS — Z1389 Encounter for screening for other disorder: Secondary | ICD-10-CM

## 2013-03-10 LAB — US OB DETAIL + 14 WK

## 2013-03-10 LAB — POCT URINALYSIS DIPSTICK
Bilirubin, UA: NEGATIVE
Blood, UA: NEGATIVE
Glucose, UA: NEGATIVE
Leukocytes, UA: NEGATIVE
Nitrite, UA: NEGATIVE
Urobilinogen, UA: NEGATIVE

## 2013-03-10 NOTE — Progress Notes (Signed)
Pulse-83 No complaints. 

## 2013-03-11 ENCOUNTER — Encounter: Payer: Self-pay | Admitting: Obstetrics

## 2013-03-11 ENCOUNTER — Other Ambulatory Visit: Payer: Self-pay | Admitting: Obstetrics

## 2013-03-11 DIAGNOSIS — O99210 Obesity complicating pregnancy, unspecified trimester: Secondary | ICD-10-CM

## 2013-03-11 DIAGNOSIS — O09299 Supervision of pregnancy with other poor reproductive or obstetric history, unspecified trimester: Secondary | ICD-10-CM

## 2013-03-12 ENCOUNTER — Encounter (HOSPITAL_COMMUNITY): Payer: Self-pay | Admitting: Obstetrics

## 2013-03-12 LAB — CULTURE, OB URINE

## 2013-03-13 ENCOUNTER — Encounter: Payer: Self-pay | Admitting: Obstetrics

## 2013-03-16 ENCOUNTER — Ambulatory Visit (HOSPITAL_COMMUNITY): Payer: Medicaid Other

## 2013-03-18 ENCOUNTER — Ambulatory Visit (HOSPITAL_COMMUNITY)
Admission: RE | Admit: 2013-03-18 | Discharge: 2013-03-18 | Disposition: A | Discharge status: Home / Self Care | Payer: Medicaid Other | Source: Ambulatory Visit | Attending: Obstetrics | Admitting: Obstetrics

## 2013-03-18 ENCOUNTER — Encounter (HOSPITAL_COMMUNITY): Payer: Self-pay

## 2013-03-18 VITALS — BP 133/79 | HR 97 | Wt 305.5 lb

## 2013-03-18 DIAGNOSIS — O337XX0 Maternal care for disproportion due to other fetal deformities, not applicable or unspecified: Secondary | ICD-10-CM | POA: Insufficient documentation

## 2013-03-18 DIAGNOSIS — O99210 Obesity complicating pregnancy, unspecified trimester: Secondary | ICD-10-CM

## 2013-03-18 DIAGNOSIS — O352XX0 Maternal care for (suspected) hereditary disease in fetus, not applicable or unspecified: Secondary | ICD-10-CM | POA: Insufficient documentation

## 2013-03-18 DIAGNOSIS — Z363 Encounter for antenatal screening for malformations: Secondary | ICD-10-CM | POA: Insufficient documentation

## 2013-03-18 DIAGNOSIS — O358XX0 Maternal care for other (suspected) fetal abnormality and damage, not applicable or unspecified: Secondary | ICD-10-CM | POA: Insufficient documentation

## 2013-03-18 DIAGNOSIS — O34219 Maternal care for unspecified type scar from previous cesarean delivery: Secondary | ICD-10-CM | POA: Insufficient documentation

## 2013-03-18 DIAGNOSIS — Z1389 Encounter for screening for other disorder: Secondary | ICD-10-CM | POA: Insufficient documentation

## 2013-03-18 DIAGNOSIS — I10 Essential (primary) hypertension: Secondary | ICD-10-CM

## 2013-03-18 DIAGNOSIS — O09299 Supervision of pregnancy with other poor reproductive or obstetric history, unspecified trimester: Secondary | ICD-10-CM | POA: Insufficient documentation

## 2013-03-18 DIAGNOSIS — E669 Obesity, unspecified: Secondary | ICD-10-CM | POA: Insufficient documentation

## 2013-03-29 ENCOUNTER — Encounter: Payer: Self-pay | Admitting: Obstetrics

## 2013-04-07 ENCOUNTER — Ambulatory Visit (INDEPENDENT_AMBULATORY_CARE_PROVIDER_SITE_OTHER): Payer: Medicaid Other | Admitting: Obstetrics

## 2013-04-07 ENCOUNTER — Other Ambulatory Visit: Payer: Medicaid Other | Admitting: *Deleted

## 2013-04-07 ENCOUNTER — Encounter: Payer: Self-pay | Admitting: Obstetrics

## 2013-04-07 VITALS — BP 121/77 | Temp 98.1°F | Wt 303.6 lb

## 2013-04-07 DIAGNOSIS — Z3480 Encounter for supervision of other normal pregnancy, unspecified trimester: Secondary | ICD-10-CM

## 2013-04-07 DIAGNOSIS — Z3482 Encounter for supervision of other normal pregnancy, second trimester: Secondary | ICD-10-CM

## 2013-04-07 LAB — POCT URINALYSIS DIPSTICK
Bilirubin, UA: NEGATIVE
Ketones, UA: NEGATIVE
Leukocytes, UA: NEGATIVE
Spec Grav, UA: 1.02
pH, UA: 6

## 2013-04-07 LAB — CBC
HCT: 32 % — ABNORMAL LOW (ref 36.0–46.0)
MCH: 29 pg (ref 26.0–34.0)
MCV: 84.4 fL (ref 78.0–100.0)
Platelets: 245 10*3/uL (ref 150–400)
RDW: 14.4 % (ref 11.5–15.5)
WBC: 11.1 10*3/uL — ABNORMAL HIGH (ref 4.0–10.5)

## 2013-04-07 NOTE — Progress Notes (Signed)
Pulse: 75

## 2013-04-08 LAB — GLUCOSE TOLERANCE, 2 HOURS W/ 1HR: Glucose, Fasting: 72 mg/dL (ref 70–99)

## 2013-04-08 LAB — OB RESULTS CONSOLE RPR: RPR: NONREACTIVE

## 2013-04-08 LAB — OB RESULTS CONSOLE HIV ANTIBODY (ROUTINE TESTING): HIV: NONREACTIVE

## 2013-04-10 ENCOUNTER — Encounter: Payer: Self-pay | Admitting: Obstetrics

## 2013-04-21 ENCOUNTER — Ambulatory Visit (INDEPENDENT_AMBULATORY_CARE_PROVIDER_SITE_OTHER): Payer: Medicaid Other | Admitting: Obstetrics

## 2013-04-21 ENCOUNTER — Encounter: Payer: Self-pay | Admitting: Obstetrics

## 2013-04-21 VITALS — BP 96/52 | Temp 98.8°F | Wt 308.0 lb

## 2013-04-21 DIAGNOSIS — Z3403 Encounter for supervision of normal first pregnancy, third trimester: Secondary | ICD-10-CM

## 2013-04-21 DIAGNOSIS — O0990 Supervision of high risk pregnancy, unspecified, unspecified trimester: Secondary | ICD-10-CM

## 2013-04-21 DIAGNOSIS — Z3480 Encounter for supervision of other normal pregnancy, unspecified trimester: Secondary | ICD-10-CM

## 2013-04-21 LAB — POCT URINALYSIS DIPSTICK
Ketones, UA: NEGATIVE
Protein, UA: NEGATIVE
Spec Grav, UA: >=1.03
Urobilinogen, UA: NEGATIVE
pH, UA: 5

## 2013-04-21 NOTE — Progress Notes (Signed)
Pulse-96 Pt c/o intermittent headaches x 1 week with photophobia. Pt reports OTC Tylenol w/o relief and does not take the Fioricet due to the caffeine.

## 2013-04-29 ENCOUNTER — Ambulatory Visit (HOSPITAL_COMMUNITY)
Admission: RE | Admit: 2013-04-29 | Discharge: 2013-04-29 | Disposition: A | Discharge status: Home / Self Care | Payer: Medicaid Other | Source: Ambulatory Visit | Attending: Obstetrics | Admitting: Obstetrics

## 2013-04-29 VITALS — BP 134/80 | HR 92 | Wt 307.5 lb

## 2013-04-29 DIAGNOSIS — O09299 Supervision of pregnancy with other poor reproductive or obstetric history, unspecified trimester: Secondary | ICD-10-CM | POA: Insufficient documentation

## 2013-04-29 DIAGNOSIS — O99210 Obesity complicating pregnancy, unspecified trimester: Secondary | ICD-10-CM | POA: Insufficient documentation

## 2013-04-29 DIAGNOSIS — E669 Obesity, unspecified: Secondary | ICD-10-CM | POA: Insufficient documentation

## 2013-04-29 DIAGNOSIS — O352XX0 Maternal care for (suspected) hereditary disease in fetus, not applicable or unspecified: Secondary | ICD-10-CM | POA: Insufficient documentation

## 2013-04-29 DIAGNOSIS — I10 Essential (primary) hypertension: Secondary | ICD-10-CM

## 2013-04-29 DIAGNOSIS — O34219 Maternal care for unspecified type scar from previous cesarean delivery: Secondary | ICD-10-CM | POA: Insufficient documentation

## 2013-04-29 DIAGNOSIS — O337XX0 Maternal care for disproportion due to other fetal deformities, not applicable or unspecified: Secondary | ICD-10-CM | POA: Insufficient documentation

## 2013-05-05 ENCOUNTER — Encounter: Payer: Medicaid Other | Admitting: Obstetrics

## 2013-05-06 ENCOUNTER — Encounter: Payer: Self-pay | Admitting: *Deleted

## 2013-05-06 ENCOUNTER — Ambulatory Visit (INDEPENDENT_AMBULATORY_CARE_PROVIDER_SITE_OTHER): Payer: Medicaid Other | Admitting: Obstetrics

## 2013-05-06 ENCOUNTER — Encounter: Payer: Self-pay | Admitting: Obstetrics

## 2013-05-06 VITALS — BP 137/90 | Wt 306.0 lb

## 2013-05-06 DIAGNOSIS — Z3483 Encounter for supervision of other normal pregnancy, third trimester: Secondary | ICD-10-CM

## 2013-05-06 DIAGNOSIS — Z3480 Encounter for supervision of other normal pregnancy, unspecified trimester: Secondary | ICD-10-CM

## 2013-05-06 LAB — POCT URINALYSIS DIPSTICK
Bilirubin, UA: NEGATIVE
Blood, UA: NEGATIVE
Ketones, UA: NEGATIVE
Protein, UA: NEGATIVE
pH, UA: 6

## 2013-05-06 NOTE — Progress Notes (Signed)
Pulse-97 Pt c/o constant mid to lower right back "throbbing" pain with intermittent "shooting" pains x 3 days.

## 2013-05-19 ENCOUNTER — Ambulatory Visit (INDEPENDENT_AMBULATORY_CARE_PROVIDER_SITE_OTHER): Payer: Medicaid Other | Admitting: Obstetrics

## 2013-05-19 ENCOUNTER — Encounter: Payer: Self-pay | Admitting: Obstetrics

## 2013-05-19 VITALS — BP 125/74 | Wt 309.2 lb

## 2013-05-19 DIAGNOSIS — Z3483 Encounter for supervision of other normal pregnancy, third trimester: Secondary | ICD-10-CM

## 2013-05-19 DIAGNOSIS — Z3480 Encounter for supervision of other normal pregnancy, unspecified trimester: Secondary | ICD-10-CM

## 2013-05-19 LAB — POCT URINALYSIS DIPSTICK
Blood, UA: NEGATIVE
Glucose, UA: NEGATIVE
Ketones, UA: NEGATIVE
Spec Grav, UA: 1.025

## 2013-05-19 NOTE — Progress Notes (Signed)
Doing well 

## 2013-05-19 NOTE — Progress Notes (Signed)
Pulse: 99 

## 2013-05-20 ENCOUNTER — Ambulatory Visit (HOSPITAL_COMMUNITY)
Admission: RE | Admit: 2013-05-20 | Discharge: 2013-05-20 | Disposition: A | Discharge status: Home / Self Care | Payer: Medicaid Other | Source: Ambulatory Visit | Attending: Obstetrics | Admitting: Obstetrics

## 2013-05-20 ENCOUNTER — Encounter: Payer: Self-pay | Admitting: Obstetrics

## 2013-05-20 DIAGNOSIS — O368190 Decreased fetal movements, unspecified trimester, not applicable or unspecified: Secondary | ICD-10-CM | POA: Insufficient documentation

## 2013-05-20 DIAGNOSIS — O352XX0 Maternal care for (suspected) hereditary disease in fetus, not applicable or unspecified: Secondary | ICD-10-CM | POA: Insufficient documentation

## 2013-05-20 DIAGNOSIS — O34219 Maternal care for unspecified type scar from previous cesarean delivery: Secondary | ICD-10-CM | POA: Insufficient documentation

## 2013-05-20 DIAGNOSIS — O99210 Obesity complicating pregnancy, unspecified trimester: Secondary | ICD-10-CM

## 2013-05-20 DIAGNOSIS — E669 Obesity, unspecified: Secondary | ICD-10-CM | POA: Insufficient documentation

## 2013-05-26 ENCOUNTER — Encounter: Payer: Self-pay | Admitting: Obstetrics & Gynecology

## 2013-05-26 LAB — US OB DETAIL + 14 WK

## 2013-06-02 ENCOUNTER — Ambulatory Visit (INDEPENDENT_AMBULATORY_CARE_PROVIDER_SITE_OTHER): Payer: Medicaid Other | Admitting: Obstetrics

## 2013-06-02 VITALS — BP 126/75 | Temp 97.6°F | Wt 315.0 lb

## 2013-06-02 DIAGNOSIS — Z3480 Encounter for supervision of other normal pregnancy, unspecified trimester: Secondary | ICD-10-CM

## 2013-06-02 DIAGNOSIS — Z3483 Encounter for supervision of other normal pregnancy, third trimester: Secondary | ICD-10-CM

## 2013-06-02 LAB — POCT URINALYSIS DIPSTICK
Bilirubin, UA: NEGATIVE
Blood, UA: NEGATIVE
Nitrite, UA: NEGATIVE
Protein, UA: NEGATIVE
pH, UA: 6

## 2013-06-02 NOTE — Progress Notes (Signed)
Pulse-92 Pt c/o lower right back pain and hip pain. Pt requesting date for c-section due to job request.

## 2013-06-03 ENCOUNTER — Encounter: Payer: Self-pay | Admitting: Obstetrics

## 2013-06-07 ENCOUNTER — Other Ambulatory Visit: Payer: Self-pay | Admitting: *Deleted

## 2013-06-08 ENCOUNTER — Encounter: Payer: Self-pay | Admitting: Obstetrics & Gynecology

## 2013-06-14 ENCOUNTER — Encounter (HOSPITAL_COMMUNITY): Payer: Self-pay | Admitting: *Deleted

## 2013-06-14 ENCOUNTER — Inpatient Hospital Stay (HOSPITAL_COMMUNITY)
Admission: AD | Admit: 2013-06-14 | Discharge: 2013-06-14 | Disposition: A | Discharge status: Home / Self Care | Payer: Medicaid Other | Source: Ambulatory Visit | Attending: Obstetrics & Gynecology | Admitting: Obstetrics & Gynecology

## 2013-06-14 ENCOUNTER — Inpatient Hospital Stay (HOSPITAL_COMMUNITY): Payer: Medicaid Other

## 2013-06-14 DIAGNOSIS — R0602 Shortness of breath: Secondary | ICD-10-CM | POA: Insufficient documentation

## 2013-06-14 DIAGNOSIS — O26893 Other specified pregnancy related conditions, third trimester: Secondary | ICD-10-CM

## 2013-06-14 DIAGNOSIS — O9989 Other specified diseases and conditions complicating pregnancy, childbirth and the puerperium: Secondary | ICD-10-CM

## 2013-06-14 DIAGNOSIS — O99891 Other specified diseases and conditions complicating pregnancy: Secondary | ICD-10-CM | POA: Insufficient documentation

## 2013-06-14 DIAGNOSIS — R079 Chest pain, unspecified: Secondary | ICD-10-CM | POA: Insufficient documentation

## 2013-06-14 HISTORY — DX: Obesity, unspecified: E66.9

## 2013-06-14 MED ORDER — IOHEXOL 350 MG/ML SOLN
100.0000 mL | Freq: Once | INTRAVENOUS | Status: AC | PRN
  Administered 2013-06-14: 100 mL via INTRAVENOUS

## 2013-06-14 MED ORDER — LACTATED RINGERS IV SOLN: INTRAVENOUS | Status: DC

## 2013-06-14 NOTE — MAU Note (Signed)
Pt c/o tightening in her upper abdomen that causes shortness of breath for the past couple of days. Today no tightening only shortness of breath.

## 2013-06-14 NOTE — MAU Note (Signed)
Patient states she has had shortness of breathe and feels like she can't tale a deep breath.

## 2013-06-14 NOTE — MAU Provider Note (Signed)
History     CSN: [id number]  Arrival date and time: 06/14/13 1631   First Provider Initiated Contact with Patient 06/14/13 1703      Chief Complaint  Patient presents with  . Shortness of Breath   HPI This is a 22 y.o. female at [redacted]w[redacted]d who presents with c/o shortness of breath off and on over the past few days. Also has had some sharp pain in Left upper chest, anteriorly. No palpitations. Currently not short of breath. History is remarkable for "Left ventricle enlargement" in first pregnancy, postpartum.  She was on antihypertensives and diuretics.  Never followed up with cardiology because her Medicaid ran out.   RN Note: Pt c/o tightening in her upper abdomen that causes shortness of breath for the past couple of days. Today no tightening only shortness of breath.      OB History   Grav Para Term Preterm Abortions TAB SAB Ect Mult Living   5 2 2  0 2 1 1   2       Past Medical History  Diagnosis Date  . Complication of anesthesia     with csections, felt like she "couldnt swallow during the delivery of baby"  . No pertinent past medical history     preeclampsia with 2010 pregnancy  . Pregnancy induced hypertension   . Ventricular hypertrophy   . History of pre-eclampsia in prior pregnancy, currently pregnant   . Headache(784.0)   . Obesity (BMI 35.0-39.9 without comorbidity)     Past Surgical History  Procedure Laterality Date  . Cesarean section      Family History  Problem Relation Age of Onset  . Stroke Maternal Uncle   . Diabetes Maternal Uncle   . Diabetes Paternal Grandmother     History  Substance Use Topics  . Smoking status: Former Smoker -- 0.25 packs/day for 4 years    Types: Cigarettes    Quit date: 11/13/2012  . Smokeless tobacco: Never Used  . Alcohol Use: No    Allergies:  Allergies  Allergen Reactions  . Penicillins Hives    Prescriptions prior to admission  Medication Sig Dispense Refill  . Prenatal Vit-Fe Fumarate-FA (PRENATAL  MULTIVITAMIN) TABS Take 1 tablet by mouth daily at 12 noon.        Review of Systems  Constitutional: Negative for fever, chills and malaise/fatigue.  Respiratory: Positive for shortness of breath. Negative for cough, sputum production and wheezing.   Cardiovascular: Positive for chest pain. Negative for palpitations.  Gastrointestinal: Negative for nausea, vomiting, abdominal pain and diarrhea.  Neurological: Negative for dizziness and focal weakness.   Physical Exam   Blood pressure 121/65, pulse 89, resp. rate 18, height 5\' 8"  (1.727 m), weight 142.611 kg (314 lb 6.4 oz), last menstrual period 10/08/2012, SpO2 99.00%.  Physical Exam  Constitutional: She is oriented to person, place, and time. She appears well-developed and well-nourished. No distress (Converses easily, no dyspnea noted).  HENT:  Head: Normocephalic.  Cardiovascular: Normal rate.   Respiratory: Effort normal. No respiratory distress. She has no wheezes. She has no rales. She exhibits tenderness (slight over 3-4th intercostal space).  GI: Soft. There is no tenderness. There is no rebound and no guarding.  Musculoskeletal: Normal range of motion.  Neurological: She is alert and oriented to person, place, and time.  Skin: Skin is warm and dry.  Psychiatric: She has a normal mood and affect.    MAU Course  Procedures  MDM Discussed with Dr Gaynell Face Will check EKG and Spiral  CT  EKG: normal EKG, normal sinus rhythm, there are no previous tracings available for comparison   Ct Angio Chest Pe W/cm &/or Wo Cm  06/14/2013   *RADIOLOGY REPORT*  Clinical Data: Shortness of breath left upper chest pain  CT ANGIOGRAPHY CHEST  Technique:  Multidetector CT imaging of the chest using the standard protocol during bolus administration of intravenous contrast. Multiplanar reconstructed images including MIPs were obtained and reviewed to evaluate the vascular anatomy.  Contrast: OMNIPAQUE IOHEXOL 350 MG/ML SOLN  Comparison:  09/24/2010  Findings: Limited exam because of patient body habitus and distal pulmonary arterial opacification.  No large central saddle embolus or proximal hilar large pulmonary embolus demonstrated.  Difficult to exclude more peripheral small emboli.  Thoracic aorta appears intact.  Negative for aneurysm or dissection.  No adenopathy. Included upper abdomen demonstrates no acute process.  Small but mildly prominent axillary lymph nodes bilaterally.  Lung windows demonstrate no acute airspace process, collapse, or consolidation.  No interstitial change or edema.  Negative for pneumothorax.  Trachea and central airways are patent.  Minor left lower lobe atelectasis.  IMPRESSION: Limited exam as above.  No significant large central saddle embolus or proximal hilar large P E.  No other acute intrathoracic finding.   Original Report Authenticated By: Judie Petit. Miles Costain, M.D.    Assessment and Plan  A:  [facility]UP at [redacted]w[redacted]d       Chest pain, intermittent shortness of breath       Normal EKG       No evidence for pulmonary embolism   P:  Discussed the pain could be costochondritis related to softening of cartilage in pregnancy      Discussed SOB may be related to size of baby and UCs which put pressure on diaphragm.      Followup with office       Needs followup visit with Cardiologist   [facility] 06/14/2013, 5:19 PM

## 2013-06-16 ENCOUNTER — Other Ambulatory Visit: Payer: Self-pay | Admitting: Obstetrics

## 2013-06-16 ENCOUNTER — Ambulatory Visit (INDEPENDENT_AMBULATORY_CARE_PROVIDER_SITE_OTHER): Payer: Medicaid Other | Admitting: Obstetrics

## 2013-06-16 VITALS — BP 124/72 | Temp 98.3°F | Wt 320.4 lb

## 2013-06-16 DIAGNOSIS — Z3483 Encounter for supervision of other normal pregnancy, third trimester: Secondary | ICD-10-CM

## 2013-06-16 DIAGNOSIS — Z3480 Encounter for supervision of other normal pregnancy, unspecified trimester: Secondary | ICD-10-CM

## 2013-06-16 LAB — POCT URINALYSIS DIPSTICK
Blood, UA: NEGATIVE
Glucose, UA: NEGATIVE
Spec Grav, UA: 1.02
Urobilinogen, UA: NEGATIVE

## 2013-06-16 NOTE — Progress Notes (Signed)
Pulse: 90

## 2013-06-17 ENCOUNTER — Ambulatory Visit (HOSPITAL_COMMUNITY)
Admission: RE | Admit: 2013-06-17 | Discharge: 2013-06-17 | Disposition: A | Discharge status: Home / Self Care | Payer: Medicaid Other | Source: Ambulatory Visit | Attending: Obstetrics | Admitting: Obstetrics

## 2013-06-17 DIAGNOSIS — O09299 Supervision of pregnancy with other poor reproductive or obstetric history, unspecified trimester: Secondary | ICD-10-CM | POA: Insufficient documentation

## 2013-06-17 DIAGNOSIS — O337XX0 Maternal care for disproportion due to other fetal deformities, not applicable or unspecified: Secondary | ICD-10-CM | POA: Insufficient documentation

## 2013-06-17 DIAGNOSIS — E669 Obesity, unspecified: Secondary | ICD-10-CM | POA: Insufficient documentation

## 2013-06-17 DIAGNOSIS — Z3480 Encounter for supervision of other normal pregnancy, unspecified trimester: Secondary | ICD-10-CM

## 2013-06-17 DIAGNOSIS — O34219 Maternal care for unspecified type scar from previous cesarean delivery: Secondary | ICD-10-CM | POA: Insufficient documentation

## 2013-06-17 DIAGNOSIS — O352XX0 Maternal care for (suspected) hereditary disease in fetus, not applicable or unspecified: Secondary | ICD-10-CM | POA: Insufficient documentation

## 2013-06-17 NOTE — Progress Notes (Signed)
Frances [name]  was seen today for an ultrasound appointment.  See full report in [facility].  Impression: IUP at 36 0/7 weeks Interval growth is appropriate (84th %tile) Normal amniotic fluid volume  Recommendations: Follow-up ultrasounds as clinically indicated.   Alpha Gula, MD

## 2013-06-23 ENCOUNTER — Ambulatory Visit (INDEPENDENT_AMBULATORY_CARE_PROVIDER_SITE_OTHER): Payer: Medicaid Other | Admitting: Obstetrics

## 2013-06-23 ENCOUNTER — Encounter: Payer: Medicaid Other | Admitting: Obstetrics

## 2013-06-23 VITALS — BP 133/84 | Temp 97.6°F | Wt 321.0 lb

## 2013-06-23 DIAGNOSIS — Z3483 Encounter for supervision of other normal pregnancy, third trimester: Secondary | ICD-10-CM

## 2013-06-23 DIAGNOSIS — Z3480 Encounter for supervision of other normal pregnancy, unspecified trimester: Secondary | ICD-10-CM

## 2013-06-23 LAB — POCT URINALYSIS DIPSTICK
Bilirubin, UA: NEGATIVE
Ketones, UA: NEGATIVE
Leukocytes, UA: NEGATIVE
pH, UA: 6

## 2013-06-23 NOTE — Progress Notes (Signed)
P 93 Patient is concerned about her C section date- very close to due date. Patient complains of pressure and hip discomfort.

## 2013-06-24 ENCOUNTER — Encounter: Payer: Self-pay | Admitting: Obstetrics

## 2013-06-25 ENCOUNTER — Institutional Professional Consult (permissible substitution): Payer: Medicaid Other | Admitting: Cardiology

## 2013-06-30 ENCOUNTER — Ambulatory Visit (INDEPENDENT_AMBULATORY_CARE_PROVIDER_SITE_OTHER): Payer: Medicaid Other | Admitting: Obstetrics

## 2013-06-30 ENCOUNTER — Encounter: Payer: Self-pay | Admitting: Obstetrics

## 2013-06-30 VITALS — BP 133/79 | Temp 98.2°F | Wt 319.0 lb

## 2013-06-30 DIAGNOSIS — Z3483 Encounter for supervision of other normal pregnancy, third trimester: Secondary | ICD-10-CM

## 2013-06-30 DIAGNOSIS — Z3480 Encounter for supervision of other normal pregnancy, unspecified trimester: Secondary | ICD-10-CM

## 2013-06-30 LAB — POCT URINALYSIS DIPSTICK
Blood, UA: NEGATIVE
Ketones, UA: NEGATIVE
Protein, UA: NEGATIVE
Spec Grav, UA: 1.02
pH, UA: 5

## 2013-06-30 NOTE — Progress Notes (Signed)
Pulse- 87 

## 2013-07-05 ENCOUNTER — Encounter (HOSPITAL_COMMUNITY): Payer: Self-pay

## 2013-07-07 ENCOUNTER — Ambulatory Visit (INDEPENDENT_AMBULATORY_CARE_PROVIDER_SITE_OTHER): Payer: Medicaid Other | Admitting: Obstetrics

## 2013-07-07 VITALS — BP 142/84 | Temp 98.6°F | Wt 323.0 lb

## 2013-07-07 DIAGNOSIS — Z3483 Encounter for supervision of other normal pregnancy, third trimester: Secondary | ICD-10-CM

## 2013-07-07 DIAGNOSIS — Z3480 Encounter for supervision of other normal pregnancy, unspecified trimester: Secondary | ICD-10-CM

## 2013-07-07 NOTE — Progress Notes (Signed)
P 102 Patient reports she did have some swelling on Friday- today she is doing well. Patient reports [name] has had 2 episodes of awakening at night cough and out of breath.

## 2013-07-08 ENCOUNTER — Encounter: Payer: Self-pay | Admitting: Obstetrics

## 2013-07-08 LAB — POCT URINALYSIS DIPSTICK
Bilirubin, UA: NEGATIVE
Blood, UA: NEGATIVE
Ketones, UA: NEGATIVE
Leukocytes, UA: NEGATIVE
Spec Grav, UA: 1.02
pH, UA: 6

## 2013-07-09 ENCOUNTER — Encounter (HOSPITAL_COMMUNITY): Payer: Self-pay

## 2013-07-13 ENCOUNTER — Encounter (HOSPITAL_COMMUNITY)
Admission: RE | Admit: 2013-07-13 | Discharge: 2013-07-13 | Disposition: A | Discharge status: Home / Self Care | Payer: Medicaid Other | Source: Ambulatory Visit | Attending: Obstetrics | Admitting: Obstetrics

## 2013-07-13 ENCOUNTER — Encounter (HOSPITAL_COMMUNITY): Payer: Self-pay

## 2013-07-13 HISTORY — DX: Shortness of breath: R06.02

## 2013-07-13 LAB — TYPE AND SCREEN: ABO/RH(D): A POS

## 2013-07-13 LAB — CBC
HCT: 34.1 % — ABNORMAL LOW (ref 36.0–46.0)
MCV: 84.6 fL (ref 78.0–100.0)
Platelets: 221 10*3/uL (ref 150–400)
RBC: 4.03 MIL/uL (ref 3.87–5.11)
WBC: 11.6 10*3/uL — ABNORMAL HIGH (ref 4.0–10.5)

## 2013-07-13 MED ORDER — GENTAMICIN SULFATE 40 MG/ML IJ SOLN
Freq: Once | INTRAMUSCULAR | Status: AC
  Administered 2013-07-14: 100 mL via INTRAVENOUS
  Filled 2013-07-13: qty 12.13

## 2013-07-13 MED ORDER — GENTAMICIN SULFATE 40 MG/ML IJ SOLN
Freq: Once | INTRAVENOUS | Status: DC
  Filled 2013-07-13: qty 17.86

## 2013-07-13 NOTE — Patient Instructions (Signed)
Frances [name]  07/13/2013   Your procedure is scheduled on:  July 14, 2013  Enter through the [employer] of [facility] at 1000 AM.  Pick up the phone at the desk and dial [phone].   Call this number if you have problems the morning of surgery: [phone]   Remember:   Do not eat food:After Midnight.  Do not drink clear liquids: After Midnight.  Take these medicines the morning of surgery with A SIP OF WATER: no medications   Do not wear jewelry, make-up or nail polish.  Do not wear lotions, powders, or perfumes. You may wear deodorant.  Do not shave 48 hours prior to surgery.  Do not bring valuables to the hospital.  [facility] is not  responsible for any belongings or valuables brought to the hospital.  Contacts, dentures or bridgework may not be worn into surgery.  Leave suitcase in the car. After surgery it may be brought to your room.  For patients admitted to the hospital, checkout time is 11:00 AM the day of              discharge.   Patients discharged the day of surgery will not be allowed to drive             home.    Special Instructions:   Shower using CHG 2 nights before surgery and the night before surgery.  If you shower the day of surgery use CHG.  Use special wash - you have one bottle of CHG for all showers.  You should use approximately 1/3 of the bottle for each shower.   Please read over the following fact sheets that you were given: skin to skin , preparing for surgery

## 2013-07-14 ENCOUNTER — Encounter (HOSPITAL_COMMUNITY): Payer: Self-pay | Admitting: Anesthesiology

## 2013-07-14 ENCOUNTER — Inpatient Hospital Stay (HOSPITAL_COMMUNITY)
Admission: RE | Admit: 2013-07-14 | Discharge: 2013-07-17 | DRG: 766 | Disposition: A | Discharge status: Home / Self Care | Payer: Medicaid Other | Source: Ambulatory Visit | Attending: Obstetrics | Admitting: Obstetrics

## 2013-07-14 ENCOUNTER — Encounter (HOSPITAL_COMMUNITY)
Admission: RE | Disposition: A | Discharge status: Home / Self Care | Payer: Self-pay | Source: Ambulatory Visit | Attending: Obstetrics

## 2013-07-14 ENCOUNTER — Inpatient Hospital Stay (HOSPITAL_COMMUNITY): Payer: Medicaid Other | Admitting: Anesthesiology

## 2013-07-14 DIAGNOSIS — O34219 Maternal care for unspecified type scar from previous cesarean delivery: Principal | ICD-10-CM | POA: Diagnosis present

## 2013-07-14 SURGERY — Surgical Case
Anesthesia: Spinal | Site: Abdomen | Wound class: Clean Contaminated

## 2013-07-14 MED ORDER — KETOROLAC TROMETHAMINE 30 MG/ML IJ SOLN: 30.0000 mg | Freq: Four times a day (QID) | INTRAMUSCULAR | Status: AC | PRN

## 2013-07-14 MED ORDER — EPHEDRINE 5 MG/ML INJ
INTRAVENOUS | Status: AC
  Filled 2013-07-14: qty 10

## 2013-07-14 MED ORDER — MEPERIDINE HCL 25 MG/ML IJ SOLN: 6.2500 mg | INTRAMUSCULAR | Status: DC | PRN

## 2013-07-14 MED ORDER — SIMETHICONE 80 MG PO CHEW: 80.0000 mg | CHEWABLE_TABLET | ORAL | Status: DC | PRN

## 2013-07-14 MED ORDER — ONDANSETRON HCL 4 MG/2ML IJ SOLN: 4.0000 mg | INTRAMUSCULAR | Status: DC | PRN

## 2013-07-14 MED ORDER — OXYCODONE-ACETAMINOPHEN 5-325 MG PO TABS
1.0000 | ORAL_TABLET | ORAL | Status: DC | PRN
  Administered 2013-07-15: 2 via ORAL
  Administered 2013-07-15: 1 via ORAL
  Administered 2013-07-15: 2 via ORAL
  Administered 2013-07-15: 1 via ORAL
  Administered 2013-07-16 – 2013-07-17 (×6): 2 via ORAL
  Filled 2013-07-14: qty 1
  Filled 2013-07-14 (×2): qty 2
  Filled 2013-07-14: qty 1
  Filled 2013-07-14: qty 2
  Filled 2013-07-14: qty 1
  Filled 2013-07-14 (×3): qty 2
  Filled 2013-07-14: qty 1
  Filled 2013-07-14 (×2): qty 2

## 2013-07-14 MED ORDER — ONDANSETRON HCL 4 MG/2ML IJ SOLN
INTRAMUSCULAR | Status: AC
  Filled 2013-07-14: qty 2

## 2013-07-14 MED ORDER — METOCLOPRAMIDE HCL 5 MG/ML IJ SOLN
10.0000 mg | Freq: Three times a day (TID) | INTRAMUSCULAR | Status: DC | PRN
  Administered 2013-07-14: 10 mg via INTRAVENOUS

## 2013-07-14 MED ORDER — WITCH HAZEL-GLYCERIN EX PADS: 1.0000 "application " | MEDICATED_PAD | CUTANEOUS | Status: DC | PRN

## 2013-07-14 MED ORDER — DIPHENHYDRAMINE HCL 25 MG PO CAPS: 25.0000 mg | ORAL_CAPSULE | ORAL | Status: DC | PRN

## 2013-07-14 MED ORDER — PHENYLEPHRINE HCL 10 MG/ML IJ SOLN
INTRAMUSCULAR | Status: DC | PRN
  Administered 2013-07-14 (×7): 40 ug via INTRAVENOUS

## 2013-07-14 MED ORDER — LACTATED RINGERS IV SOLN
INTRAVENOUS | Status: DC
  Administered 2013-07-14 (×2): via INTRAVENOUS

## 2013-07-14 MED ORDER — OXYTOCIN 10 UNIT/ML IJ SOLN
40.0000 [IU] | INTRAVENOUS | Status: DC | PRN
  Administered 2013-07-14: 40 [IU] via INTRAVENOUS

## 2013-07-14 MED ORDER — ONDANSETRON HCL 4 MG/2ML IJ SOLN: 4.0000 mg | Freq: Three times a day (TID) | INTRAMUSCULAR | Status: DC | PRN

## 2013-07-14 MED ORDER — MORPHINE SULFATE (PF) 0.5 MG/ML IJ SOLN
INTRAMUSCULAR | Status: DC | PRN
  Administered 2013-07-14: .15 mg via INTRATHECAL

## 2013-07-14 MED ORDER — LACTATED RINGERS IV SOLN
INTRAVENOUS | Status: DC
  Administered 2013-07-14: 19:00:00 via INTRAVENOUS

## 2013-07-14 MED ORDER — PHENYLEPHRINE 40 MCG/ML (10ML) SYRINGE FOR IV PUSH (FOR BLOOD PRESSURE SUPPORT)
PREFILLED_SYRINGE | INTRAVENOUS | Status: AC
  Filled 2013-07-14: qty 10

## 2013-07-14 MED ORDER — MORPHINE SULFATE 0.5 MG/ML IJ SOLN
INTRAMUSCULAR | Status: AC
  Filled 2013-07-14: qty 10

## 2013-07-14 MED ORDER — KETOROLAC TROMETHAMINE 30 MG/ML IJ SOLN
30.0000 mg | Freq: Four times a day (QID) | INTRAMUSCULAR | Status: AC | PRN
  Administered 2013-07-14: 30 mg via INTRAMUSCULAR

## 2013-07-14 MED ORDER — MENTHOL 3 MG MT LOZG: 1.0000 | LOZENGE | OROMUCOSAL | Status: DC | PRN

## 2013-07-14 MED ORDER — SIMETHICONE 80 MG PO CHEW
80.0000 mg | CHEWABLE_TABLET | Freq: Three times a day (TID) | ORAL | Status: DC
  Administered 2013-07-14 – 2013-07-16 (×7): 80 mg via ORAL

## 2013-07-14 MED ORDER — SCOPOLAMINE 1 MG/3DAYS TD PT72
1.0000 | MEDICATED_PATCH | Freq: Once | TRANSDERMAL | Status: DC
  Administered 2013-07-14: 1.5 mg via TRANSDERMAL

## 2013-07-14 MED ORDER — BUPIVACAINE IN DEXTROSE 0.75-8.25 % IT SOLN
INTRATHECAL | Status: DC | PRN
  Administered 2013-07-14: 2 mL via INTRATHECAL

## 2013-07-14 MED ORDER — NALBUPHINE HCL 10 MG/ML IJ SOLN
5.0000 mg | INTRAMUSCULAR | Status: DC | PRN
  Filled 2013-07-14: qty 1

## 2013-07-14 MED ORDER — LANOLIN HYDROUS EX OINT: 1.0000 "application " | TOPICAL_OINTMENT | CUTANEOUS | Status: DC | PRN

## 2013-07-14 MED ORDER — ONDANSETRON HCL 4 MG PO TABS: 4.0000 mg | ORAL_TABLET | ORAL | Status: DC | PRN

## 2013-07-14 MED ORDER — PRENATAL MULTIVITAMIN CH
1.0000 | ORAL_TABLET | Freq: Every day | ORAL | Status: DC
  Administered 2013-07-15 – 2013-07-17 (×2): 1 via ORAL
  Filled 2013-07-14 (×2): qty 1

## 2013-07-14 MED ORDER — DIPHENHYDRAMINE HCL 25 MG PO CAPS: 25.0000 mg | ORAL_CAPSULE | Freq: Four times a day (QID) | ORAL | Status: DC | PRN

## 2013-07-14 MED ORDER — FENTANYL CITRATE 0.05 MG/ML IJ SOLN: 25.0000 ug | INTRAMUSCULAR | Status: DC | PRN

## 2013-07-14 MED ORDER — NALOXONE HCL 0.4 MG/ML IJ SOLN: 0.4000 mg | INTRAMUSCULAR | Status: DC | PRN

## 2013-07-14 MED ORDER — EPHEDRINE SULFATE 50 MG/ML IJ SOLN
INTRAMUSCULAR | Status: DC | PRN
  Administered 2013-07-14 (×4): 5 mg via INTRAVENOUS

## 2013-07-14 MED ORDER — PNEUMOCOCCAL VAC POLYVALENT 25 MCG/0.5ML IJ INJ
0.5000 mL | INJECTION | INTRAMUSCULAR | Status: AC
  Administered 2013-07-15: 0.5 mL via INTRAMUSCULAR
  Filled 2013-07-14: qty 0.5

## 2013-07-14 MED ORDER — MEDROXYPROGESTERONE ACETATE 150 MG/ML IM SUSP: 150.0000 mg | INTRAMUSCULAR | Status: DC | PRN

## 2013-07-14 MED ORDER — IBUPROFEN 600 MG PO TABS
600.0000 mg | ORAL_TABLET | Freq: Four times a day (QID) | ORAL | Status: DC
  Administered 2013-07-14 – 2013-07-17 (×11): 600 mg via ORAL
  Filled 2013-07-14 (×11): qty 1

## 2013-07-14 MED ORDER — SODIUM CHLORIDE 0.9 % IJ SOLN: 3.0000 mL | INTRAMUSCULAR | Status: DC | PRN

## 2013-07-14 MED ORDER — SCOPOLAMINE 1 MG/3DAYS TD PT72: 1.0000 | MEDICATED_PATCH | TRANSDERMAL | Status: DC

## 2013-07-14 MED ORDER — DIPHENHYDRAMINE HCL 50 MG/ML IJ SOLN: 25.0000 mg | INTRAMUSCULAR | Status: DC | PRN

## 2013-07-14 MED ORDER — FENTANYL CITRATE 0.05 MG/ML IJ SOLN
INTRAMUSCULAR | Status: AC
  Filled 2013-07-14: qty 2

## 2013-07-14 MED ORDER — SENNOSIDES-DOCUSATE SODIUM 8.6-50 MG PO TABS
2.0000 | ORAL_TABLET | Freq: Every day | ORAL | Status: DC
  Administered 2013-07-14 – 2013-07-16 (×3): 2 via ORAL

## 2013-07-14 MED ORDER — TETANUS-DIPHTH-ACELL PERTUSSIS 5-2.5-18.5 LF-MCG/0.5 IM SUSP
0.5000 mL | Freq: Once | INTRAMUSCULAR | Status: AC
  Administered 2013-07-15: 0.5 mL via INTRAMUSCULAR

## 2013-07-14 MED ORDER — DIBUCAINE 1 % RE OINT: 1.0000 "application " | TOPICAL_OINTMENT | RECTAL | Status: DC | PRN

## 2013-07-14 MED ORDER — METOCLOPRAMIDE HCL 5 MG/ML IJ SOLN
INTRAMUSCULAR | Status: AC
  Administered 2013-07-14: 10 mg via INTRAVENOUS
  Filled 2013-07-14: qty 2

## 2013-07-14 MED ORDER — ZOLPIDEM TARTRATE 5 MG PO TABS: 5.0000 mg | ORAL_TABLET | Freq: Every evening | ORAL | Status: DC | PRN

## 2013-07-14 MED ORDER — SCOPOLAMINE 1 MG/3DAYS TD PT72
MEDICATED_PATCH | TRANSDERMAL | Status: AC
  Administered 2013-07-14: 1.5 mg via TRANSDERMAL
  Filled 2013-07-14: qty 1

## 2013-07-14 MED ORDER — NALOXONE HCL 1 MG/ML IJ SOLN
1.0000 ug/kg/h | INTRAVENOUS | Status: DC | PRN
  Filled 2013-07-14: qty 2

## 2013-07-14 MED ORDER — KETOROLAC TROMETHAMINE 30 MG/ML IJ SOLN
INTRAMUSCULAR | Status: AC
  Filled 2013-07-14: qty 1

## 2013-07-14 MED ORDER — DIPHENHYDRAMINE HCL 50 MG/ML IJ SOLN: 12.5000 mg | INTRAMUSCULAR | Status: DC | PRN

## 2013-07-14 MED ORDER — LACTATED RINGERS IV SOLN
INTRAVENOUS | Status: DC
  Administered 2013-07-14: 12:00:00 via INTRAVENOUS

## 2013-07-14 MED ORDER — ONDANSETRON HCL 4 MG/2ML IJ SOLN
INTRAMUSCULAR | Status: DC | PRN
  Administered 2013-07-14: 4 mg via INTRAVENOUS

## 2013-07-14 MED ORDER — FENTANYL CITRATE 0.05 MG/ML IJ SOLN
INTRAMUSCULAR | Status: DC | PRN
  Administered 2013-07-14: 25 ug via INTRATHECAL

## 2013-07-14 MED ORDER — OXYTOCIN 40 UNITS IN LACTATED RINGERS INFUSION - SIMPLE MED: 62.5000 mL/h | INTRAVENOUS | Status: AC

## 2013-07-14 SURGICAL SUPPLY — 42 items
ADH SKN CLS APL DERMABOND .7 (GAUZE/BANDAGES/DRESSINGS) ×1
CANISTER WOUND CARE 500ML ATS (WOUND CARE) IMPLANT
CLAMP CORD UMBIL (MISCELLANEOUS) IMPLANT
CLOTH BEACON ORANGE TIMEOUT ST (SAFETY) ×2 IMPLANT
CONTAINER PREFILL 10% NBF 15ML (MISCELLANEOUS) ×4 IMPLANT
DERMABOND ADVANCED (GAUZE/BANDAGES/DRESSINGS) ×1
DERMABOND ADVANCED .7 DNX12 (GAUZE/BANDAGES/DRESSINGS) ×1 IMPLANT
DRAPE LG THREE QUARTER DISP (DRAPES) ×2 IMPLANT
DRSG OPSITE POSTOP 4X10 (GAUZE/BANDAGES/DRESSINGS) ×2 IMPLANT
DRSG VAC ATS LRG SENSATRAC (GAUZE/BANDAGES/DRESSINGS) IMPLANT
DRSG VAC ATS MED SENSATRAC (GAUZE/BANDAGES/DRESSINGS) IMPLANT
DRSG VAC ATS SM SENSATRAC (GAUZE/BANDAGES/DRESSINGS) IMPLANT
DURAPREP 26ML APPLICATOR (WOUND CARE) ×2 IMPLANT
ELECT REM PT RETURN 9FT ADLT (ELECTROSURGICAL) ×2
ELECTRODE REM PT RTRN 9FT ADLT (ELECTROSURGICAL) ×1 IMPLANT
EXTRACTOR VACUUM M CUP 4 TUBE (SUCTIONS) IMPLANT
GLOVE BIO SURGEON STRL SZ8 (GLOVE) ×4 IMPLANT
GOWN PREVENTION PLUS XLARGE (GOWN DISPOSABLE) ×2 IMPLANT
GOWN STRL REIN XL XLG (GOWN DISPOSABLE) ×4 IMPLANT
KIT ABG SYR 3ML LUER SLIP (SYRINGE) IMPLANT
NEEDLE HYPO 25X5/8 SAFETYGLIDE (NEEDLE) IMPLANT
NS IRRIG 1000ML POUR BTL (IV SOLUTION) ×2 IMPLANT
PACK C SECTION WH (CUSTOM PROCEDURE TRAY) ×2 IMPLANT
PAD OB MATERNITY 4.3X12.25 (PERSONAL CARE ITEMS) ×2 IMPLANT
RTRCTR C-SECT PINK 25CM LRG (MISCELLANEOUS) ×1 IMPLANT
STAPLER VISISTAT 35W (STAPLE) ×1 IMPLANT
SUT GUT PLAIN 0 CT-3 TAN 27 (SUTURE) IMPLANT
SUT MNCRL 0 VIOLET CTX 36 (SUTURE) ×3 IMPLANT
SUT MNCRL AB 4-0 PS2 18 (SUTURE) IMPLANT
SUT MON AB 2-0 CT1 27 (SUTURE) ×2 IMPLANT
SUT MON AB 3-0 SH 27 (SUTURE)
SUT MON AB 3-0 SH27 (SUTURE) IMPLANT
SUT MONOCRYL 0 CTX 36 (SUTURE) ×3
SUT PDS AB 0 CTX 60 (SUTURE) ×2 IMPLANT
SUT PLAIN 2 0 XLH (SUTURE) IMPLANT
SUT VIC AB 0 CTX 36 (SUTURE) ×2
SUT VIC AB 0 CTX36XBRD ANBCTRL (SUTURE) IMPLANT
SUT VIC AB 2-0 CT1 27 (SUTURE)
SUT VIC AB 2-0 CT1 TAPERPNT 27 (SUTURE) IMPLANT
TOWEL OR 17X24 6PK STRL BLUE (TOWEL DISPOSABLE) ×2 IMPLANT
TRAY FOLEY CATH 14FR (SET/KITS/TRAYS/PACK) ×2 IMPLANT
WATER STERILE IRR 1000ML POUR (IV SOLUTION) ×1 IMPLANT

## 2013-07-14 NOTE — Anesthesia Postprocedure Evaluation (Signed)
  Anesthesia Post-op Note  Patient: Frances [name]  Procedure(s) Performed: Procedure(s): REPEAT CESAREAN SECTION (N/A)  Patient Location: PACU  Anesthesia Type:Spinal  Level of Consciousness: awake, alert  and oriented  Airway and Oxygen Therapy: Patient Spontanous Breathing  Post-op Pain: none  Post-op Assessment: Post-op Vital signs reviewed, Patient's Cardiovascular Status Stable, Respiratory Function Stable, Patent Airway, No signs of Nausea or vomiting, Pain level controlled, No headache and No backache  Post-op Vital Signs: Reviewed and stable  Complications: No apparent anesthesia complications

## 2013-07-14 NOTE — Brief Op Note (Signed)
07/14/2013  12:55 PM  PATIENT:  Frances [name]  22 y.o. female  PRE-OPERATIVE DIAGNOSIS:  prev c/s, desires repeat c/s  POST-OPERATIVE DIAGNOSIS:  same  PROCEDURE:  Procedure(s): REPEAT CESAREAN SECTION (N/A)  SURGEON:  Surgeon(s) and Role:    * Brock Bad, MD - Primary    * Kathreen Cosier, MD - Assisting  PHYSICIAN ASSISTANT:   ASSISTANTS: Francoise Ceo   ANESTHESIA:   spinal  EBL:  Total I/O In: 2100 [I.V.:2100] Out: 900 [Urine:200; Blood:700]  BLOOD ADMINISTERED:none  DRAINS: none   LOCAL MEDICATIONS USED:  NONE  SPECIMEN:  No Specimen  DISPOSITION OF SPECIMEN:  N/A  COUNTS:  YES  TOURNIQUET:  none  DICTATION: .Note written in EPIC  PLAN OF CARE: Admit to inpatient   PATIENT DISPOSITION:  PACU - hemodynamically stable.   Delay start of Pharmacological VTE agent (>24hrs) due to surgical blood loss or risk of bleeding: not applicable

## 2013-07-14 NOTE — Anesthesia Preprocedure Evaluation (Signed)
Anesthesia Evaluation  Patient identified by MRN, date of birth, ID band  Reviewed: Allergy & Precautions, H&P , Patient's Chart, lab work & pertinent test results  Airway Mallampati: III TM Distance: >3 FB Neck ROM: Full    Dental no notable dental hx. (+) Teeth Intact   Pulmonary shortness of breath, former smoker,  breath sounds clear to auscultation  Pulmonary exam normal       Cardiovascular hypertension, Rhythm:Regular Rate:Normal  Preeclampsia with prior pregnancy   Neuro/Psych  Headaches, negative neurological ROS  negative psych ROS   GI/Hepatic Neg liver ROS, GERD-  Medicated and Controlled,  Endo/Other  Morbid obesity  Renal/GU negative Renal ROS  negative genitourinary   Musculoskeletal negative musculoskeletal ROS (+)   Abdominal (+) + obese,   Peds  Hematology negative hematology ROS (+)   Anesthesia Other Findings   Reproductive/Obstetrics (+) Pregnancy Previous C/Section                           Anesthesia Physical Anesthesia Plan  ASA: III  Anesthesia Plan: Spinal   Post-op Pain Management:    Induction:   Airway Management Planned: Natural Airway  Additional Equipment:   Intra-op Plan:   Post-operative Plan:   Informed Consent: I have reviewed the patients History and Physical, chart, labs and discussed the procedure including the risks, benefits and alternatives for the proposed anesthesia with the patient or authorized representative who has indicated his/her understanding and acceptance.     Plan Discussed with: Anesthesiologist, CRNA and Surgeon  Anesthesia Plan Comments:         Anesthesia Quick Evaluation

## 2013-07-14 NOTE — H&P (Signed)
Frances [name] is a 22 y.o. female presenting for repeat C/S. Maternal Medical History:  Reason for admission: 22 yo G5 P2022.  EDC 07-16-13.  Presents for repeat C/S at 39.[redacted] weeks gestation.  Fetal activity: Perceived fetal activity is normal.   Last perceived fetal movement was within the past hour.    Prenatal Complications - Diabetes: none.    OB History   Grav Para Term Preterm Abortions TAB SAB Ect Mult Living   5 2 2  0 2 1 1   2      Past Medical History  Diagnosis Date  . Complication of anesthesia     with csections, felt like she "couldnt swallow during the delivery of baby"  . No pertinent past medical history     preeclampsia with 2010 pregnancy  . Pregnancy induced hypertension   . Ventricular hypertrophy   . History of pre-eclampsia in prior pregnancy, currently pregnant   . Headache(784.0)   . Obesity (BMI 35.0-39.9 without comorbidity)   . Shortness of breath     sob during the night at times   Past Surgical History  Procedure Laterality Date  . Cesarean section     Family History: family history includes Diabetes in her maternal uncle and paternal grandmother; Stroke in her maternal uncle. Social History:  reports that she quit smoking about 7 months ago. Her smoking use included Cigarettes. She has a 1 pack-year smoking history. She has never used smokeless tobacco. She reports that she does not drink alcohol or use illicit drugs.   Prenatal Transfer Tool  Maternal Diabetes: No Genetic Screening: Normal Maternal Ultrasounds/Referrals: Normal Fetal Ultrasounds or other Referrals:  None Maternal Substance Abuse:  No Significant Maternal Medications:  None Significant Maternal Lab Results:  None Other Comments:  None  Review of Systems  All other systems reviewed and are negative.      Blood pressure 127/71, pulse 87, temperature 98.2 F (36.8 C), temperature source Oral, resp. rate 16, height 5\' 8"  (1.727 m), last menstrual period 10/08/2012, SpO2  100.00%. Maternal Exam:  Abdomen: Patient reports no abdominal tenderness. Surgical scars: low transverse.   Cervix: not evaluated.   Physical Exam  Nursing note and vitals reviewed. Constitutional: She is oriented to person, place, and time. She appears well-developed and well-nourished.  HENT:  Head: Normocephalic and atraumatic.  Eyes: Conjunctivae are normal. Pupils are equal, round, and reactive to light.  Neck: Normal range of motion. Neck supple.  Cardiovascular: Normal rate and regular rhythm.   Respiratory: Effort normal.  GI: Soft.  Musculoskeletal: Normal range of motion.  Neurological: She is alert and oriented to person, place, and time.  Skin: Skin is warm and dry.  Psychiatric: She has a normal mood and affect. Her behavior is normal. Judgment and thought content normal.    Prenatal labs: ABO, Rh: --/--/A POS (09/02 1545) Antibody: NEG (09/02 1545) Rubella: 5.91 (03/31 0949) RPR: NON REACTIVE (09/02 1545)  HBsAg: NEGATIVE (03/31 0949)  HIV: Non-reactive (05/29 0000)  GBS:     Assessment/Plan:  39 weeks. Desires repeat C/S.   HARPER,CHARLES A 07/14/2013, 10:53 AM

## 2013-07-14 NOTE — Op Note (Signed)
Cesarean Section Procedure Note   Frances [name]   07/14/2013  Indications: Scheduled Proceedure/Maternal Request   Pre-operative Diagnosis: prev c/s.   Post-operative Diagnosis: Same   Surgeon: Coral Ceo A  Assistants: Francoise Ceo  Anesthesia: spinal  Procedure Details:  The patient was seen in the Holding Room. The risks, benefits, complications, treatment options, and expected outcomes were discussed with the patient. The patient concurred with the proposed plan, giving informed consent. The patient was identified as Frances [name] and the procedure verified as C-Section Delivery. A Time Out was held and the above information confirmed.  After induction of anesthesia, the patient was draped and prepped in the usual sterile manner. A transverse incision was made and carried down through the subcutaneous tissue to the fascia. The fascial incision was made and extended transversely. The fascia was separated from the underlying rectus tissue superiorly and inferiorly. The peritoneum was identified and entered. The peritoneal incision was extended longitudinally. The utero-vesical peritoneal reflection was incised transversely and the bladder flap was bluntly freed from the lower uterine segment. A low transverse uterine incision was made. Delivered from cephalic presentation was a 3910 gram living newborn female infant(s). APGAR (1 MIN): 9   APGAR (5 MINS): 9   APGAR (10 MINS):    A cord ph was not sent. The umbilical cord was clamped and cut cord. A sample was obtained for evaluation. The placenta was removed Intact and appeared normal.  The uterine incision was closed with running locked sutures of 1-0 Monocryl. A second imbricating layer of the same suture was placed.  Hemostasis was observed. The paracolic gutters were irrigated. The parieto peritoneum was closed in a running fashion with 2-0 Vicryl.  The fascia was then reapproximated with running sutures of 0 Vicryl.  The skin  was closed with staples.  Instrument, sponge, and needle counts were correct prior the abdominal closure and were correct at the conclusion of the case.    Findings:   Estimated Blood Loss:  Total IV Fluids:   Urine Output: 200CC OF clear urine  Specimens: None  Complications: no complications  Disposition: PACU - hemodynamically stable.  Maternal Condition: stable   Baby condition / location:  nursery-stable    Signed: Surgeon(s): Brock Bad, MD Kathreen Cosier, MD

## 2013-07-14 NOTE — Transfer of Care (Signed)
Immediate Anesthesia Transfer of Care Note  Patient: Frances [name]  Procedure(s) Performed: Procedure(s): REPEAT CESAREAN SECTION (N/A)  Patient Location: PACU  Anesthesia Type:Spinal  Level of Consciousness: awake, alert , oriented and patient cooperative  Airway & Oxygen Therapy: Patient Spontanous Breathing  Post-op Assessment: Report given to PACU RN and Post -op Vital signs reviewed and stable  Post vital signs: stable  Complications: No apparent anesthesia complications

## 2013-07-15 ENCOUNTER — Encounter (HOSPITAL_COMMUNITY): Payer: Self-pay | Admitting: Obstetrics

## 2013-07-15 LAB — CBC
HCT: 31.5 % — ABNORMAL LOW (ref 36.0–46.0)
Hemoglobin: 10.6 g/dL — ABNORMAL LOW (ref 12.0–15.0)
RBC: 3.67 MIL/uL — ABNORMAL LOW (ref 3.87–5.11)
WBC: 10.4 10*3/uL (ref 4.0–10.5)

## 2013-07-15 NOTE — Progress Notes (Signed)
Subjective: Postpartum Day 1: Cesarean Delivery Patient reports tolerating PO, + flatus and no problems voiding.    Objective: Vital signs in last 24 hours: Temp:  [97.3 F (36.3 C)-98.7 F (37.1 C)] 98.2 F (36.8 C) (09/04 0641) Pulse Rate:  [71-108] 88 (09/04 0641) Resp:  [16-25] 18 (09/04 0641) BP: (94-137)/(56-83) 119/79 mmHg (09/04 0641) SpO2:  [93 %-100 %] 97 % (09/04 0641) Weight:  [321 lb (145.605 kg)] 321 lb (145.605 kg) (09/03 1500)  Physical Exam:  General: alert and no distress Lochia: appropriate Uterine Fundus: firm Incision: healing well DVT Evaluation: No evidence of DVT seen on physical exam.   Recent Labs  07/13/13 1545 07/15/13 0625  HGB 11.5* 10.6*  HCT 34.1* 31.5*    Assessment/Plan: Status post Cesarean section. Doing well postoperatively.  Continue current care.  Cassara Nida A 07/15/2013, 9:26 AM

## 2013-07-16 NOTE — Progress Notes (Signed)
Subjective: Postpartum Day 2: Cesarean Delivery Patient reports tolerating PO, + flatus and no problems voiding.    Objective: Vital signs in last 24 hours: Temp:  [97.6 F (36.4 C)-98.4 F (36.9 C)] 98.4 F (36.9 C) (09/05 0525) Pulse Rate:  [75-84] 75 (09/05 0525) Resp:  [16-18] 18 (09/05 0525) BP: (119-131)/(76-83) 119/77 mmHg (09/05 0525) SpO2:  [98 %-99 %] 98 % (09/04 1400)  Physical Exam:  General: alert and no distress Lochia: appropriate Uterine Fundus: firm Incision: healing well DVT Evaluation: No evidence of DVT seen on physical exam.   Recent Labs  07/13/13 1545 07/15/13 0625  HGB 11.5* 10.6*  HCT 34.1* 31.5*    Assessment/Plan: Status post Cesarean section. Doing well postoperatively.  Continue current care.  Saadiq Poche A 07/16/2013, 8:48 AM

## 2013-07-17 MED ORDER — OXYCODONE-ACETAMINOPHEN 5-325 MG PO TABS: 1.0000 | ORAL_TABLET | ORAL | Status: DC | PRN

## 2013-07-17 NOTE — Discharge Summary (Signed)
  Obstetric Discharge Summary Reason for Admission: cesarean section Prenatal Procedures: none Intrapartum Procedures: cesarean: low cervical, transverse Postpartum Procedures: none Complications-Operative and Postpartum: none  Hemoglobin  Date Value Range Status  07/15/2013 10.6* 12.0 - 15.0 g/dL Final     HCT  Date Value Range Status  07/15/2013 31.5* 36.0 - 46.0 % Final    Physical Exam:  General: alert Lochia: appropriate Uterine: firm Incision: dressing dry DVT Evaluation: No evidence of DVT seen on physical exam.  Discharge Diagnoses: Active Problems:   Cesarean delivery delivered   Discharge Information: Date: 07/17/2013 Activity: pelvic rest Diet: routine Medications:  Prior to Admission medications   Medication Sig Start Date End Date Taking? Authorizing Provider  Prenatal Vit-Fe Fumarate-FA (PRENATAL MULTIVITAMIN) TABS Take 1 tablet by mouth daily at 12 noon.   Yes Historical Provider, MD  oxyCODONE-acetaminophen (PERCOCET/ROXICET) 5-325 MG per tablet Take 1-2 tablets by mouth every 4 (four) hours as needed. 07/17/13   Antionette Char, MD    Condition: stable Instructions: refer to routine discharge instructions Discharge to: home Follow-up Information   Follow up with HARPER,CHARLES A, MD.   Specialty:  Obstetrics and Gynecology   Contact information:   [address] [place] [address] [phone]       Newborn Data:  Live born female  Birth Weight: 8 lb 9.9 oz (3910 g) APGAR: 9, 9   Home with mother.  JACKSON-MOORE,Crist Kruszka A 07/17/2013, 1:14 PM

## 2013-07-19 ENCOUNTER — Encounter: Payer: Self-pay | Admitting: Obstetrics

## 2013-07-22 ENCOUNTER — Ambulatory Visit (INDEPENDENT_AMBULATORY_CARE_PROVIDER_SITE_OTHER): Payer: Medicaid Other | Admitting: Obstetrics

## 2013-07-22 ENCOUNTER — Encounter: Payer: Self-pay | Admitting: Obstetrics

## 2013-07-22 DIAGNOSIS — R52 Pain, unspecified: Secondary | ICD-10-CM | POA: Insufficient documentation

## 2013-07-22 MED ORDER — OXYCODONE-ACETAMINOPHEN 10-325 MG PO TABS: 1.0000 | ORAL_TABLET | ORAL | Status: DC | PRN

## 2013-07-22 NOTE — Progress Notes (Signed)
.   Subjective:     Frances [name] is a 22 y.o. female who presents for a postpartum visit. She is 1 weeks postpartum following a low cervical transverse Cesarean section. I have fully reviewed the prenatal and intrapartum course. The delivery was at 40 gestational weeks. Outcome: primary cesarean section, low transverse incision. Anesthesia: epidural. Postpartum course has been nornmal. Baby's course has been normal. Baby is feeding by bottle Rush Barer. Bleeding red. Bowel function is normal. Bladder function is burning during urination.. Patient is not sexually active. Contraception method is none. Postpartum depression screening: negative.  The following portions of the patient's history were reviewed and updated as appropriate: allergies, current medications, past family history, past medical history, past social history, past surgical history and problem list.  Review of Systems Pertinent items are noted in HPI.   Objective:    BP 120/80  Pulse 103  Temp(Src) 98.4 F (36.9 C) (Oral)  Ht 5\' 9"  (1.753 m)  Wt 293 lb 6.4 oz (133.085 kg)  BMI 43.31 kg/m2  LMP 10/08/2012  Breastfeeding? No  General:  alert and no distress Abdomen:  Soft, NT.  Incision C, D, I.  Staples removed and steri strips applied.    Assessment:     Normal postpartum exam. Pap smear not done at today's visit.   Plan:    1. Contraception: none 2. Contraceptive options discussed.  Considering IUD vs. Nexplanon. 3. Follow up in: 2 weeks or as needed.

## 2013-08-05 ENCOUNTER — Ambulatory Visit (INDEPENDENT_AMBULATORY_CARE_PROVIDER_SITE_OTHER): Payer: Medicaid Other | Admitting: Obstetrics

## 2013-08-05 ENCOUNTER — Encounter: Payer: Self-pay | Admitting: Obstetrics

## 2013-08-05 DIAGNOSIS — IMO0002 Reserved for concepts with insufficient information to code with codable children: Secondary | ICD-10-CM | POA: Insufficient documentation

## 2013-08-05 MED ORDER — HYDROCORTISONE ACETATE 25 MG RE SUPP: 25.0000 mg | Freq: Two times a day (BID) | RECTAL | Status: DC

## 2013-08-05 NOTE — Progress Notes (Signed)
.   Subjective:     Frances [name] is a 22 y.o. female who presents for a postpartum visit. She is 4 weeks postpartum following a low cervical transverse Cesarean section. I have fully reviewed the prenatal and intrapartum course. The delivery was at 39.5 gestational weeks. Outcome: repeat cesarean section, low transverse incision. Anesthesia: epidural. Postpartum course has been normal. Baby's course has been normal. Baby is feeding by bottle Rush Barer. Bleeding staining only. Bowel function is normal. Bladder function is normal. Patient is not sexually active. Contraception method is abstinence. Postpartum depression screening: negative.  The following portions of the patient's history were reviewed and updated as appropriate: allergies, current medications, past family history, past medical history, past social history, past surgical history and problem list.  Review of Systems Pertinent items are noted in HPI.   Objective:    BP 117/81  Pulse 77  Temp(Src) 98.3 F (36.8 C) (Oral)  Ht 5\' 9"  (1.753 m)  LMP 10/08/2012  Breastfeeding? No  General:  alert and no distress   Breasts:  inspection negative, no nipple discharge or bleeding, no masses or nodularity palpable  Abdomen:  Soft, NT.  Incision C, D, I.                                 Assessment:     Normal postpartum exam. Pap smear not done at today's visit.    Counseling for contraception.  Wants Mirena IUD.  Plan:    1. Contraception: IUD 2.  Continue PNV's 3. Follow up in: 6 weeks or as needed.  IUD insertion next visit.

## 2013-09-16 ENCOUNTER — Ambulatory Visit (INDEPENDENT_AMBULATORY_CARE_PROVIDER_SITE_OTHER): Payer: Medicaid Other | Admitting: Obstetrics

## 2013-09-16 ENCOUNTER — Ambulatory Visit: Payer: Medicaid Other | Admitting: Obstetrics

## 2013-09-16 ENCOUNTER — Other Ambulatory Visit: Payer: Self-pay

## 2013-09-16 ENCOUNTER — Encounter: Payer: Self-pay | Admitting: Obstetrics

## 2013-09-16 VITALS — BP 128/83 | HR 88 | Temp 98.0°F | Ht 69.0 in | Wt 302.0 lb

## 2013-09-16 DIAGNOSIS — IMO0001 Reserved for inherently not codable concepts without codable children: Secondary | ICD-10-CM

## 2013-09-16 DIAGNOSIS — Z3202 Encounter for pregnancy test, result negative: Secondary | ICD-10-CM

## 2013-09-16 DIAGNOSIS — Z3009 Encounter for other general counseling and advice on contraception: Secondary | ICD-10-CM

## 2013-09-16 DIAGNOSIS — Z309 Encounter for contraceptive management, unspecified: Secondary | ICD-10-CM

## 2013-09-16 MED ORDER — NORELGESTROMIN-ETH ESTRADIOL 150-35 MCG/24HR TD PTWK: 1.0000 | MEDICATED_PATCH | TRANSDERMAL | Status: DC

## 2013-09-16 NOTE — Progress Notes (Signed)
.   Subjective:     Frances [name] is a 22 y.o. female here for a birth control consult.   No current complaints. She is now interested in the patch.   Personal health questionnaire reviewed: yes.   Gynecologic History No LMP recorded. Contraception: none Last Pap: 02/08/2013. Results were: ASCUS Last mammogram: N/A  Obstetric History OB History  Gravida Para Term Preterm AB SAB TAB Ectopic Multiple Living  5 3 3  0 2 1 1   3     # Outcome Date GA Lbr Len/2nd Weight Sex Delivery Anes PTL Lv  5 TRM 07/14/13 [redacted]w[redacted]d  8 lb 9.9 oz (3.91 kg) M LTCS Spinal  Y  4 TRM 2011 [redacted]w[redacted]d    CS     3 TRM 2010 [redacted]w[redacted]d    CS     2 TAB 2005          1 SAB                The following portions of the patient's history were reviewed and updated as appropriate: allergies, current medications, past family history, past medical history, past social history, past surgical history and problem list.  Review of Systems Pertinent items are noted in HPI.    Objective:    No exam performed today, Consult only..    Assessment:    Counseling for contraception done.   Plan:     Education reviewed: safe sex/STD prevention and contraceptive options. Contraception: Ortho-Evra patches weekly. Follow up in: 3 months. Ortho Evra Patch Rx.

## 2013-11-12 ENCOUNTER — Encounter (HOSPITAL_COMMUNITY): Payer: Self-pay | Admitting: Emergency Medicine

## 2013-11-12 ENCOUNTER — Emergency Department (HOSPITAL_COMMUNITY)
Admission: EM | Admit: 2013-11-12 | Discharge: 2013-11-12 | Disposition: A | Discharge status: Home / Self Care | Payer: Medicaid Other | Attending: Emergency Medicine | Admitting: Emergency Medicine

## 2013-11-12 ENCOUNTER — Emergency Department (HOSPITAL_COMMUNITY): Payer: Medicaid Other

## 2013-11-12 DIAGNOSIS — M79609 Pain in unspecified limb: Secondary | ICD-10-CM

## 2013-11-12 DIAGNOSIS — Z880 Allergy status to penicillin: Secondary | ICD-10-CM | POA: Insufficient documentation

## 2013-11-12 DIAGNOSIS — M25562 Pain in left knee: Secondary | ICD-10-CM

## 2013-11-12 DIAGNOSIS — M25569 Pain in unspecified knee: Secondary | ICD-10-CM | POA: Insufficient documentation

## 2013-11-12 DIAGNOSIS — Z79899 Other long term (current) drug therapy: Secondary | ICD-10-CM | POA: Insufficient documentation

## 2013-11-12 DIAGNOSIS — F17200 Nicotine dependence, unspecified, uncomplicated: Secondary | ICD-10-CM | POA: Insufficient documentation

## 2013-11-12 DIAGNOSIS — Z8742 Personal history of other diseases of the female genital tract: Secondary | ICD-10-CM | POA: Insufficient documentation

## 2013-11-12 DIAGNOSIS — R52 Pain, unspecified: Secondary | ICD-10-CM

## 2013-11-12 DIAGNOSIS — Z8669 Personal history of other diseases of the nervous system and sense organs: Secondary | ICD-10-CM | POA: Insufficient documentation

## 2013-11-12 DIAGNOSIS — I517 Cardiomegaly: Secondary | ICD-10-CM | POA: Insufficient documentation

## 2013-11-12 DIAGNOSIS — Z8709 Personal history of other diseases of the respiratory system: Secondary | ICD-10-CM | POA: Insufficient documentation

## 2013-11-12 DIAGNOSIS — E669 Obesity, unspecified: Secondary | ICD-10-CM | POA: Insufficient documentation

## 2013-11-12 NOTE — Progress Notes (Signed)
VASCULAR LAB PRELIMINARY  PRELIMINARY  PRELIMINARY  PRELIMINARY  Left lower extremity venous duplex completed.    Preliminary report:  Left:  No evidence of DVT, superficial thrombosis, or Baker's cyst.  Frances [name], RVS 11/12/2013, 5:46 PM

## 2013-11-12 NOTE — ED Notes (Signed)
The pt stood up at work today and when she did her lt knee started to hurt.  No known injury.  No previous history .  The pt has no pain except when she attempts to move

## 2013-11-12 NOTE — Discharge Instructions (Signed)
Please call and set-up an appointment with Dr. Ophelia Charter, orthopedics, regarding left knee pain Please keep left knee in sleeve at all times Please rest, ice, elevate Please take Ibuprofen as needed for pain Please avoid any physical or strenuous activity Please continue to monitor symptoms and if symptoms are to worsen or change (fever greater than 101, chills, swelling, redness, streaking, numbness, tingling, loss of sensation, weakness, fall, injury) please report back to the ED  Knee Pain Knee pain can be a result of an injury or other medical conditions. Treatment will depend on the cause of your pain. HOME CARE  Only take medicine as told by your doctor.  Keep a healthy weight. Being overweight can make the knee hurt more.  Stretch before exercising or playing sports.  If there is constant knee pain, change the way you exercise. Ask your doctor for advice.  Make sure shoes fit well. Choose the right shoe for the sport or activity.  Protect your knees. Wear kneepads if needed.  Rest when you are tired. GET HELP RIGHT AWAY IF:   Your knee pain does not stop.  Your knee pain does not get better.  Your knee joint feels hot to the touch.  You have a fever. MAKE SURE YOU:   Understand these instructions.  Will watch this condition.  Will get help right away if you are not doing well or get worse. Document Released: 01/24/2009 Document Revised: 01/20/2012 Document Reviewed: 01/24/2009 [facility] Patient Information 2014 [place], [place].   Emergency Department Resource Guide 1) Find a Doctor and Pay Out of Pocket Although you won't have to find out who is covered by your insurance plan, it is a good idea to ask around and get recommendations. You will then need to call the office and see if the doctor you have chosen will accept you as a new patient and what types of options they offer for patients who are self-pay. Some doctors offer discounts or will set up payment plans for  their patients who do not have insurance, but you will need to ask so you aren't surprised when you get to your appointment.  2) Contact Your Local Health Department Not all health departments have doctors that can see patients for sick visits, but many do, so it is worth a call to see if yours does. If you don't know where your local health department is, you can check in your phone book. The CDC also has a tool to help you locate your state's health department, and many state websites also have listings of all of their local health departments.  3) Find a Walk-in Clinic If your illness is not likely to be very severe or complicated, you may want to try a walk in clinic. These are popping up all over the country in pharmacies, drugstores, and shopping centers. They're usually staffed by nurse practitioners or physician assistants that have been trained to treat common illnesses and complaints. They're usually fairly quick and inexpensive. However, if you have serious medical issues or chronic medical problems, these are probably not your best option.  No Primary Care Doctor: - Call Health Connect at  [phone] - they can help you locate a primary care doctor that  accepts your insurance, provides certain services, etc. - Physician Referral Service- [phone]  Chronic Pain Problems: Organization         Address  Phone   Notes  [facility]  [phone] Patients need to be referred by their  primary care doctor.   Medication Assistance: Organization         Address  Phone   Notes  [facility] [address] [place], [place] [address] [phone] --Must be a resident of [facility] -- Must have NO insurance coverage whatsoever (no Medicaid/ Medicare, etc.) -- The pt. MUST have a primary care doctor that directs their care regularly and follows them in the community   [facility]  [phone]   [employer]  [phone]    Agencies that provide inexpensive medical care: Organization         Address  Phone   Notes  [facility]  [phone]   [facility]    [phone]   [facility] [address] [place], [place] [address] [phone]   [facility] [address], [place] [phone]   Planned Parenthood    [phone]   [facility]    [phone]   [facility]  [address], [place] Phone:  [phone], Fax:  [phone] Hours of Operation:  9 am - 6 pm, M-F.  Also accepts Medicaid/Medicare and self-pay.  [facility]  [address], [place] Phone: [phone], Fax: [phone]. Hours of Operation:  8:30 am - 5:30 pm, M-F.  Also accepts Medicaid and self-pay.  [facility] [place] [address] [place] Phone: [phone]   [facility] [address] Natasha Bence [place], [place] [phone], Ext. [phone] Mondays & Thursdays: 7-9 AM.  First 15 patients are seen on a first come, first serve basis.    Medicaid-accepting [facility] Providers:  Organization         Address  Phone   Notes  [facility] [address], [place] [phone] Also accepts self-pay patients.  [facility] [address] Laurell Josephs [place], [place]  [phone]   [facility] [address], [place] [phone]   [facility] [address], [place] [phone]   Renaye Rakers [address], [place]   [phone] Only accepts [place] patients after they have their name applied to their card.   Self-Pay (no insurance) in [facility]:  Organization         Address  Phone   Notes  Sickle Cell Patients, [facility] [address] [place], [place] [phone]   [facility] [address] [place], [place] [phone]   [facility] [place]  [address], [place] [phone]   [facility]/Dr. Osei-Bonsu  [address], [place] or [address], [place] [phone] Phone number for both [place] and [place] locations is the same.  [facility] [address], [place] [phone]   [facility] [address] [place] or [address] [phone]   [facility] [address], [place] [phone], phone; [phone], fax Sees patients 1st and 3rd Saturday of every month.  Must not qualify for public or private insurance (i.e. Medicaid, Medicare, [place] Health Choice, Veterans' Benefits)  Household income should be no more than 200%  of the poverty level The clinic cannot treat you if you are pregnant or think you are pregnant  Sexually transmitted diseases are not treated at the clinic.    Dental Care: Organization         Address  Phone  Notes  [facility] Department [facility] [address] [place], [place] [phone] Accepts children up to age 11 who are enrolled in [place] or [place] Health Choice; pregnant women with a Medicaid card; and children who have applied for Medicaid or [place] Health Choice, but were declined, whose parents can pay a reduced fee at time of service.  [facility] of [facility]  [address], [place] [phone] Accepts children up to age 74 who are enrolled in [place] or [place] Health Choice; pregnant women with a Medicaid card; and children who have applied for Medicaid or [place] Health Choice, but were declined, whose parents can pay a reduced fee at time of service.  [facility]  [address] [place], [place] [phone] Patients are  seen by appointment only. Walk-ins are not accepted. [facility] will see patients 6 years of age and older. Monday - Tuesday (8am-5pm) Most Wednesdays (8:30-5pm) $30 per visit, cash only  [facility] Adult Dental Access PROGRAM  [address], [facility] [phone] Patients are seen by appointment only. Walk-ins are not accepted. [facility] will see patients 35 years of age and older. One Wednesday Evening (Monthly: Volunteer Based).  $30 per visit, cash only  [employer]  [phone] for adults; Children under age 66, call Graduate Pediatric Dentistry at [phone]. Children aged 10-14, please call [phone] to request a pediatric application.  Dental services are provided in all areas of dental care including fillings, crowns and bridges, complete and partial dentures, implants, gum treatment, root canals, and extractions. Preventive care is also provided. Treatment is provided to both adults and children. Patients are selected via a lottery and there is often a waiting list.   [facility] [address] [place]  [phone] [email or web address]   [facility] [address] [place], [place] [phone], Ext. [phone] Second and Fourth Thursday of each month, opens at 6:30 AM; Clinic ends at 9 AM.  Patients are seen on a first-come first-served basis, and a limited number are seen during each clinic.   [facility]  [address] Ether Griffins [place], [place] [phone]   Eligibility Requirements You must have lived in [place], [place], or [place] counties for at least the last three months.   You cannot be eligible for state or federal sponsored [place], including [employer], [place], or [employer].   You generally cannot be eligible for healthcare insurance through your employer.    How to apply: Eligibility screenings are held every Tuesday and Wednesday afternoon from 1:00 pm until 4:00  pm. You do not need an appointment for the interview!  [facility] [address], [place], [place] [phone]   [facility] Health Department  [phone]   [facility] Health Department  [phone]   [facility]  [phone]    Behavioral Health Resources in the Community: Intensive Outpatient Programs Organization         Address  Phone  Notes  [facility] [address], [place], [place] [phone]   [facility] Outpatient  [address], [place], [place] [phone]   [facility] [address], [place], [place]  [phone]   [facility] [address],  [place], [place] [phone] or [phone]   Substance Abuse Resources Organization         Address  Phone  Notes  [facility]  [phone]   [facility]  [phone]   [facility]  [phone]   [facility]  [phone]   Residential & Outpatient Substance Abuse Program  [phone]   Psychological Services Organization         Address  Phone  Notes  [facility]  [phone]- [phone]   [facility]  [phone]- [phone]   [facility] [address], [place] [phone] or [phone]    Mobile Crisis Teams Organization         Address  Phone  Notes  [facility], Mobile Crisis Care Unit  [phone]   [facility]  [address] [place], [place] [phone]   Doristine LocksSharon DeEsch [address] [place] [phone]    Self-Help/Support Groups Organization         Address  Phone             Notes  [facility] [place] - variety of support groups  336- [phone] Call for more information  Narcotics Anonymous (NA), [facility] [address], [place]  2 meetings at this location   Statisticianesidential Treatment Programs Organization          Address  Phone  Notes  [facility] [address],    [place]  [phone]   [facility]  [address], [place] [phone], [place], [place] [phone]   [facility] [address], [place] [phone] Admissions: 8am-3pm M-F  [facility] [address]    [place], [place] [phone]   [facility] [address], [place], [place] [phone]   [facility] [address]  [place], [place] [phone]   [facility] - Intensive Outpatient [address], [place], [place] [phone]   [facility] ([facility] [address]  [place], [place] [phone] or [phone]   [facility] ([facility]) [address] [place], [place] [phone] Accepts Medicaid  [facility] [address]  [place] [phone] Substance Abuse/Addiction Treatment   [facility] Resources Organization         Address  Phone  Notes  [facility]  [phone]) [phone]   Angie FavaJulie Brannon, PhD [address] [place], [place]   [phone] or [phone]   [facility]   [address] [place], [place] [phone]   [facility] [address], [place], [place] [phone] Insurance/Medicaid/sponsorship through [facility]  [facility] [address]                                    [place], [place] [phone] Therapy/tele-psych/case  [facility] [address].   [place], [place] [phone]    Dr. Lolly MustacheArfeen  [phone]   [facility]  [employer] [facility] 1) [address], [place] 2) [address], [place] 3)  [address],  [place] [phone]  [phone]   [place] [phone] or [phone] (After Hours)

## 2013-11-12 NOTE — ED Provider Notes (Signed)
CSN: [id number]     Arrival date & time 11/12/13  1528 History  This chart was scribed for Raymon Mutton, PA-C, working with Gerhard Munch, MD, by Ardelia Mems ED Scribe. This patient was seen in room TR11C/TR11C and the patient's care was started at 4:35 PM.   Chief Complaint  Patient presents with  . Knee Pain    The history is provided by the patient. No language interpreter was used.    HPI Comments: MISCHELL BRANFORD is a 23 y.o. female with a history of obesity who presents to the Emergency Department complaining of posterior left knee pain onset this morning, when she got up from a sitting position at work. She states that since onset her pain has been constant, sharp and non-radiating. She denies numbness, tingling or loss of sensation. She denies associated leg swelling or calf pain. She denies any known injury to have onset this pain. She denies any prior history of knee pain. She states that she has been on birth control for the past 6 weeks. She denies having a history of blood clots. She denies chest pain, SOB or difficulty breathing or any other symptoms.   Past Medical History  Diagnosis Date  . Complication of anesthesia     with csections, felt like she "couldnt swallow during the delivery of baby"  . No pertinent past medical history     preeclampsia with 2010 pregnancy  . Pregnancy induced hypertension   . Ventricular hypertrophy   . History of pre-eclampsia in prior pregnancy, currently pregnant   . Headache(784.0)   . Obesity (BMI 35.0-39.9 without comorbidity)   . Shortness of breath     sob during the night at times   Past Surgical History  Procedure Laterality Date  . Cesarean section    . Cesarean section N/A 07/14/2013    Procedure: REPEAT CESAREAN SECTION;  Surgeon: Brock Bad, MD;  Location: [facility];  Service: Obstetrics;  Laterality: N/A;   Family History  Problem Relation Age of Onset  . Stroke Maternal Uncle   . Diabetes Maternal Uncle   .  Diabetes Paternal Grandmother    History  Substance Use Topics  . Smoking status: Current Every Day Smoker -- 0.25 packs/day for 4 years    Types: Cigarettes    Last Attempt to Quit: 11/13/2012  . Smokeless tobacco: Never Used  . Alcohol Use: No     Comment: socially pre pregnant   OB History   Grav Para Term Preterm Abortions TAB SAB Ect Mult Living   5 3 3  0 2 1 1   3      Review of Systems  Respiratory: Negative for shortness of breath.   Cardiovascular: Negative for chest pain and leg swelling.  Musculoskeletal: Positive for arthralgias (left knee).  Neurological: Negative for numbness.  All other systems reviewed and are negative.   Allergies  Penicillins  Home Medications   Current Outpatient Rx  Name  Route  Sig  Dispense  Refill  . hydrocortisone (ANUSOL-HC) 25 MG suppository   Rectal   Place 1 suppository (25 mg total) rectally 2 (two) times daily.   24 suppository   11   . norelgestromin-ethinyl estradiol (ORTHO EVRA) 150-35 MCG/24HR transdermal patch   Transdermal   Place 1 patch onto the skin once a week.   3 patch   12   . oxyCODONE-acetaminophen (PERCOCET) 10-325 MG per tablet   Oral   Take 1 tablet by mouth every 4 (four) hours as  needed for pain.   40 tablet   0   . Prenatal Vit-Fe Fumarate-FA (PRENATAL MULTIVITAMIN) TABS   Oral   Take 1 tablet by mouth daily at 12 noon.          Triage Vitals: BP 136/78  Pulse 75  Temp(Src) 98.1 F (36.7 C) (Oral)  Resp 20  Wt 317 lb 6 oz (143.96 kg)  SpO2 98%  LMP 10/14/2013  Physical Exam  Nursing note and vitals reviewed. Constitutional: She is oriented to person, place, and time. She appears well-developed and well-nourished. No distress.  HENT:  Head: Normocephalic and atraumatic.  Eyes: Conjunctivae are normal.  Neck: Normal range of motion. Neck supple.  Cardiovascular: Normal rate, regular rhythm and normal heart sounds.  Exam reveals no friction rub.   No murmur heard. Pulses:       Radial pulses are 2+ on the right side, and 2+ on the left side.       Dorsalis pedis pulses are 2+ on the right side, and 2+ on the left side.  Pulmonary/Chest: Effort normal and breath sounds normal. No respiratory distress. She has no wheezes. She has no rales.  Abdominal:  Obese   Musculoskeletal: Normal range of motion. She exhibits tenderness.       Legs: Negative swelling, erythema, lesions, sores, deformities noted to the left knee. Discomfort upon palpation to the posterior aspect of the left knee and proximal aspect of the calf. Negative warmth upon palpation. Discomfort with flexion and extension of the left knee. Full range of motion to the left ankle and digits of the left foot. Negative ataxia with motion. Negative crepitus to the left knee. Negative anterior and posterior draw sign. Stable left knee joint noted.  Neurological: She is alert and oriented to person, place, and time. She exhibits normal muscle tone. Coordination normal.  Strength 5+/5+ lower extremities bilaterally with resistance applied, equal distribution noted Strength intact to the digits of the left foot Sensation intact   Skin: She is not diaphoretic.    ED Course  Procedures (including critical care time)  DIAGNOSTIC STUDIES: Oxygen Saturation is 98% on RA, normal by my interpretation.    COORDINATION OF CARE: 4:41 PM- Discussed negative left knee radiology findings. Discussed plan to obtain an Ultrasound. Pt advised of plan for treatment and pt agrees.  6:01 PM- Recheck with pt and discussed findings. Discussed plan to refer pt to Ortho. Advised return precautions.  Dg Knee Complete 4 Views Left  11/12/2013   CLINICAL DATA:  Severe left knee pain.  EXAM: LEFT KNEE - COMPLETE 4+ VIEW  COMPARISON:  No priors.  FINDINGS: Multiple views of the left knee demonstrate no acute displaced fracture, subluxation, dislocation, joint or soft tissue abnormality.  IMPRESSION: 1. No acute radiographic abnormality of  the left knee.   Electronically Signed   By: Trudie Reed M.D.   On: 11/12/2013 16:19   Labs Review Labs Reviewed - No data to display Imaging Review Dg Knee Complete 4 Views Left  11/12/2013   CLINICAL DATA:  Severe left knee pain.  EXAM: LEFT KNEE - COMPLETE 4+ VIEW  COMPARISON:  No priors.  FINDINGS: Multiple views of the left knee demonstrate no acute displaced fracture, subluxation, dislocation, joint or soft tissue abnormality.  IMPRESSION: 1. No acute radiographic abnormality of the left knee.   Electronically Signed   By: Trudie Reed M.D.   On: 11/12/2013 16:19    EKG Interpretation   None  MDM   1. Pain aggravated by activities of daily living   2. Left knee pain     Filed Vitals:   11/12/13 1535 11/12/13 1554 11/12/13 1824  BP: 129/69 136/78 145/84  Pulse: 79 75 80  Temp: 98.8 F (37.1 C) 98.1 F (36.7 C) 98.4 F (36.9 C)  TempSrc: Oral Oral Oral  Resp: 18 20 20   Weight: 317 lb 6 oz (143.96 kg)    SpO2: 97% 98% 96%   I personally performed the services described in this documentation, which was scribed in my presence. The recorded information has been reviewed and is accurate.  Patient presenting to emergency department with left knee pain that started suddenly this afternoon while the patient was at work. Patient reports that she mainly sits in a chair while at work. Reported that the pain is localized to the posterior aspect of her knee. Patient recently had a child in September 2014. Patient currently on birth control. Alert and oriented. GCS 15. Heart rate and rhythm normal. Pulses palpable and strong, radial and DP 2+ bilaterally. Negative swelling or deformities noted to the left knee. Discomfort upon palpation the posterior aspect of the left knee and proximal left calf. Negative warmth upon palpation. Negative signs of effusion. Negative crepitus. Discomfort with motion-flexion extension of the left knee. Negative anterior posterior draw sign.  Stable left knee joint. Strength intact with resistance applied, equal distribution noted. Sensation intact. Plain film of left knee negative for dislocation, fracture, subluxation or joint effusion. Lower extremity Doppler of left leg negative for Baker's cyst, DVT. Patient neurologically intact. Negative findings on plain film. Negative findings on Doppler. Doubt DVT. Doubt blood clot. Doubt acute fracture or abnormalities. Suspicion to be possible muscular issue secondary to pain with motion upon palpation. Patient placed in the knee sleeve. Patient stable, afebrile. Discharge patient. Recommended patient to continue to use Tylenol as needed. Referred patient to orthopedics. Discussed with patient to rest, ice, elevate. Discussed with patient to closely monitor symptoms and if symptoms are to worsen or change to report back to the ED - strict return instructions given.  Patient agreed to plan of care, understood, all questions answered.     Raymon MuttonMarissa Mark Hassey, PA-C 11/13/13 [phone]

## 2013-11-14 NOTE — ED Provider Notes (Signed)
  Medical screening examination/treatment/procedure(s) were performed by non-physician practitioner and as supervising physician I was immediately available for consultation/collaboration.  EKG Interpretation   None          Buren Havey, MD 11/14/13 0004 

## 2013-12-23 ENCOUNTER — Ambulatory Visit: Payer: Medicaid Other | Admitting: Obstetrics

## 2014-01-15 ENCOUNTER — Emergency Department (HOSPITAL_COMMUNITY)
Admission: EM | Admit: 2014-01-15 | Discharge: 2014-01-15 | Disposition: A | Discharge status: Home / Self Care | Payer: Medicaid Other | Attending: Emergency Medicine | Admitting: Emergency Medicine

## 2014-01-15 ENCOUNTER — Encounter (HOSPITAL_COMMUNITY): Payer: Self-pay | Admitting: Emergency Medicine

## 2014-01-15 DIAGNOSIS — J039 Acute tonsillitis, unspecified: Secondary | ICD-10-CM | POA: Insufficient documentation

## 2014-01-15 DIAGNOSIS — F17200 Nicotine dependence, unspecified, uncomplicated: Secondary | ICD-10-CM | POA: Insufficient documentation

## 2014-01-15 DIAGNOSIS — Z8679 Personal history of other diseases of the circulatory system: Secondary | ICD-10-CM | POA: Insufficient documentation

## 2014-01-15 DIAGNOSIS — E669 Obesity, unspecified: Secondary | ICD-10-CM | POA: Insufficient documentation

## 2014-01-15 DIAGNOSIS — Z880 Allergy status to penicillin: Secondary | ICD-10-CM | POA: Insufficient documentation

## 2014-01-15 LAB — RAPID STREP SCREEN (MED CTR MEBANE ONLY): STREPTOCOCCUS, GROUP A SCREEN (DIRECT): NEGATIVE

## 2014-01-15 MED ORDER — HYDROCODONE-ACETAMINOPHEN 5-325 MG PO TABS: 1.0000 | ORAL_TABLET | ORAL | Status: DC | PRN

## 2014-01-15 MED ORDER — ERYTHROMYCIN BASE 500 MG PO TABS: 250.0000 mg | ORAL_TABLET | Freq: Three times a day (TID) | ORAL | Status: DC

## 2014-01-15 NOTE — ED Provider Notes (Signed)
Medical screening examination/treatment/procedure(s) were conducted as a shared visit with non-physician practitioner(s) and myself.  I personally evaluated the patient during the encounter.   EKG Interpretation None      I interviewed and examined the patient. Lungs are CTAB. Cardiac exam wnl. Abdomen soft.  No trismus, normal rom of neck, posterior oropharynx easily visualized. Asymmetric tonsillar swelling w/ L>R. Uvula is midline. Possible early PTA. Pt is afebrile and well appearing. Strep neg. Pt handling secretions appropriately. Mild alteration in voice. Will recommend recheck tomorrow w/ repeat clinical exam vs CT vs aspiration pending providers evaluation.   Junius ArgyleForrest S Jamilia Jacques, MD 01/15/14 1204

## 2014-01-15 NOTE — Discharge Instructions (Signed)
Peritonsillar Abscess A peritonsillar abscess is a collection of pus located in the back of the throat behind the tonsils. It usually occurs when a streptococcal infection of the throat or tonsils spreads into the space around the tonsils. They are almost always caused by the streptococcal germ (bacteria). The treatment of a peritonsillar abscess is most often drainage accomplished by putting a needle into the abscess or cutting (incising) and draining the abscess. This is most often followed with a course of antibiotics. HOME CARE INSTRUCTIONS  If your abscess was drained by your caregiver today, rinse your throat (gargle) with warm salt water four times per day or as needed for comfort. Do not swallow this mixture. Mix 1 teaspoon of salt in 8 ounces of warm water for gargling.  Rest in bed as needed. Resume activities as able.  Apply cold to your neck for pain relief. Fill a plastic bag with ice and wrap it in a towel. Hold the ice on your neck for 20 minutes 4 times per day.  Eat a soft or liquid diet as tolerated while your throat remains sore. Popsicles and ice cream may be good early choices. Drinking plenty of cold fluids will probably be soothing and help take swelling down in between the warm gargles.  Only take over-the-counter or prescription medicines for pain, discomfort, or fever as directed by your caregiver. Do not use aspirin unless directed by your physician. Aspirin slows down the clotting process. It can also cause bleeding from the drainage area if this was needled or incised today.  If antibiotics were prescribed, take them as directed for the full course of the prescription. Even if you feel you are well, you need to take them. SEEK MEDICAL CARE IF:   You have increased pain, swelling, redness, or drainage in your throat.  You develop signs of infection such as dizziness, headache, lethargy, or generalized feelings of illness.  You have difficulty breathing, swallowing or  eating.  You show signs of becoming dehydrated (lightheadedness when standing, decreased urine output, a fast heart rate, or dry mouth and mucous membranes). SEEK IMMEDIATE MEDICAL CARE IF:   You have a fever.  You are coughing up or vomiting blood.  You develop more severe throat pain uncontrolled with medicines or you start to drool.  You develop difficulty breathing, talking, or find it easier to breathe while leaning forward. Document Released: 10/28/2005 Document Revised: 01/20/2012 Document Reviewed: 06/10/2008 [facility] Patient Information 2014 [place], [place]. Tonsillitis Tonsillitis is an infection of the throat that causes the tonsils to become red, tender, and swollen. Tonsils are collections of lymphoid tissue at the back of the throat. Each tonsil has crevices (crypts). Tonsils help fight nose and throat infections and keep infection from spreading to other parts of the body for the first 18 months of life.  CAUSES Sudden (acute) tonsillitis is usually caused by infection with streptococcal bacteria. Long-lasting (chronic) tonsillitis occurs when the crypts of the tonsils become filled with pieces of food and bacteria, which makes it easy for the tonsils to become repeatedly infected. SYMPTOMS  Symptoms of tonsillitis include:  A sore throat, with possible difficulty swallowing.  White patches on the tonsils.  Fever.  Tiredness.  New episodes of snoring during sleep, when you did not snore before.  Small, foul-smelling, yellowish-white pieces of material (tonsilloliths) that you occasionally cough up or spit out. The tonsilloliths can also cause you to have bad breath. DIAGNOSIS Tonsillitis can be diagnosed through a physical exam. Diagnosis can be  confirmed with the results of lab tests, including a throat culture. TREATMENT  The goals of tonsillitis treatment include the reduction of the severity and duration of symptoms and prevention of associated conditions.  Symptoms of tonsillitis can be improved with the use of steroids to reduce the swelling. Tonsillitis caused by bacteria can be treated with antibiotics. Usually, treatment with antibiotics is started before the cause of the tonsillitis is known. However, if it is determined that the cause is not bacterial, antibiotics will not treat the tonsillitis. If attacks of tonsillitis are severe and frequent, your caregiver may recommend surgery to remove the tonsils (tonsillectomy). HOME CARE INSTRUCTIONS   Rest as much as possible and get plenty of sleep.  Drink plenty of fluids. While the throat is very sore, eat soft foods or liquids, such as sherbet, soups, or instant breakfast drinks.  Eat frozen ice pops.  Gargle with a warm or cold liquid to help soothe the throat. Mix 1/4 teaspoon of salt and 1/4 teaspoon of baking soda in in 8 oz of water. SEEK MEDICAL CARE IF:   Large, tender lumps develop in your neck.  A rash develops.  A green, yellow-brown, or bloody substance is coughed up.  You are unable to swallow liquids or food for 24 hours.  You notice that only one of the tonsils is swollen. SEEK IMMEDIATE MEDICAL CARE IF:   You develop any new symptoms such as vomiting, severe headache, stiff neck, chest pain, or trouble breathing or swallowing.  You have severe throat pain along with drooling or voice changes.  You have severe pain, unrelieved with recommended medications.  You are unable to fully open the mouth.  You develop redness, swelling, or severe pain anywhere in the neck.  You have a fever. MAKE SURE YOU:   Understand these instructions.  Will watch your condition.  Will get help right away if you are not doing well or get worse. Document Released: 08/07/2005 Document Revised: 06/30/2013 Document Reviewed: 04/16/2013 [facility] Patient Information 2014 [place], [place].

## 2014-01-15 NOTE — ED Notes (Signed)
Pt presents to department for evaluation of sore throat. Ongoing x1 day. Denies fever. Pt is alert and oriented x4. Respirations unlabored. No signs of acute distress noted.

## 2014-01-15 NOTE — ED Notes (Signed)
Pt discharged home. To return to ED tomorrow for re-check. Also instructed to follow up with specialist if symptoms persist.

## 2014-01-15 NOTE — ED Provider Notes (Signed)
CSN: [id number]     Arrival date & time 01/15/14  7829 History   First MD Initiated Contact with Patient 01/15/14 [phone]     Chief Complaint  Patient presents with  . Sore Throat     (Consider location/radiation/quality/duration/timing/severity/associated sxs/prior Treatment) Patient is a 23 y.o. female presenting with pharyngitis. The history is provided by the patient. No language interpreter was used.  Sore Throat This is a new problem. The current episode started today. Associated symptoms include a sore throat. Pertinent negatives include no abdominal pain, congestion, coughing, fever, myalgias, nausea or rash. Associated symptoms comments: Left sided sore throat since late yesterday. She is having pain with swallowing but able to swallow liquids and solids. No known fever. She reports history of strep throat but not of abscess. .    Past Medical History  Diagnosis Date  . Complication of anesthesia     with csections, felt like she "couldnt swallow during the delivery of baby"  . No pertinent past medical history     preeclampsia with 2010 pregnancy  . Pregnancy induced hypertension   . Ventricular hypertrophy   . History of pre-eclampsia in prior pregnancy, currently pregnant   . Headache(784.0)   . Obesity (BMI 35.0-39.9 without comorbidity)   . Shortness of breath     sob during the night at times   Past Surgical History  Procedure Laterality Date  . Cesarean section    . Cesarean section N/A 07/14/2013    Procedure: REPEAT CESAREAN SECTION;  Surgeon: Brock Bad, MD;  Location: [facility];  Service: Obstetrics;  Laterality: N/A;   Family History  Problem Relation Age of Onset  . Stroke Maternal Uncle   . Diabetes Maternal Uncle   . Diabetes Paternal Grandmother    History  Substance Use Topics  . Smoking status: Current Every Day Smoker -- 0.25 packs/day for 4 years    Types: Cigarettes    Last Attempt to Quit: 11/13/2012  . Smokeless tobacco: Never Used  .  Alcohol Use: No     Comment: social   OB History   Grav Para Term Preterm Abortions TAB SAB Ect Mult Living   5 3 3  0 2 1 1   3      Review of Systems  Constitutional: Negative for fever.  HENT: Positive for sore throat and voice change. Negative for congestion and sinus pressure.   Respiratory: Negative for cough and shortness of breath.   Gastrointestinal: Negative for nausea and abdominal pain.  Musculoskeletal: Negative for myalgias.  Skin: Negative for rash.      Allergies  Penicillins  Home Medications   Current Outpatient Rx  Name  Route  Sig  Dispense  Refill  . norelgestromin-ethinyl estradiol (ORTHO EVRA) 150-35 MCG/24HR transdermal patch   Transdermal   Place 1 patch onto the skin once a week.   3 patch   12    BP 132/70  Pulse 93  Temp(Src) 98.6 F (37 C) (Oral)  Resp 20  SpO2 99% Physical Exam  Constitutional: She appears well-developed and well-nourished.  HENT:  Head: Normocephalic.  Left Ear: External ear normal.  Left tonsillar swelling without significant purulence. Uvula midline. Right tonsil and peritonsillar area benign.  Eyes: Conjunctivae are normal.  Neck: Normal range of motion. Neck supple.  Pulmonary/Chest: Effort normal.  Abdominal: Soft. Bowel sounds are normal. There is no tenderness. There is no rebound and no guarding.  Musculoskeletal: Normal range of motion.  Neurological: She is alert. No cranial nerve  deficit.  Skin: Skin is warm and dry. No rash noted.  Psychiatric: She has a normal mood and affect.    ED Course  Procedures (including critical care time) Labs Review Labs Reviewed  RAPID STREP SCREEN  CULTURE, GROUP A STREP   Results for orders placed during the hospital encounter of 01/15/14  RAPID STREP SCREEN      Result Value Ref Range   Streptococcus, Group A Screen (Direct) NEGATIVE  NEGATIVE    Imaging Review No results found.   EKG Interpretation None      MDM   Final diagnoses:  None    1.  Tonsillitis  DDx:  Uncomplicated tonsillitis vs. Early peritonsillar abscess. With uvular midline, patient afebrile and no definite peritonsillar abscess, feel no I&D is warranted based on current presentation. Patient is agreeable to returning tomorrow for 24 hours recheck. Will refer to ENT for Monday appointment if no better. Strict return precautions given, outside of recommended recheck in ED tomorrow.     Arnoldo HookerShari A Rhett Mutschler, PA-C 01/15/14 [phone]

## 2014-01-16 ENCOUNTER — Encounter (HOSPITAL_COMMUNITY): Payer: Self-pay | Admitting: Emergency Medicine

## 2014-01-16 ENCOUNTER — Emergency Department (HOSPITAL_COMMUNITY)
Admission: EM | Admit: 2014-01-16 | Discharge: 2014-01-16 | Disposition: A | Discharge status: Home / Self Care | Payer: Medicaid Other | Attending: Emergency Medicine | Admitting: Emergency Medicine

## 2014-01-16 DIAGNOSIS — O139 Gestational [pregnancy-induced] hypertension without significant proteinuria, unspecified trimester: Secondary | ICD-10-CM | POA: Insufficient documentation

## 2014-01-16 DIAGNOSIS — F17200 Nicotine dependence, unspecified, uncomplicated: Secondary | ICD-10-CM | POA: Insufficient documentation

## 2014-01-16 DIAGNOSIS — R599 Enlarged lymph nodes, unspecified: Secondary | ICD-10-CM | POA: Insufficient documentation

## 2014-01-16 DIAGNOSIS — Z880 Allergy status to penicillin: Secondary | ICD-10-CM | POA: Insufficient documentation

## 2014-01-16 DIAGNOSIS — R498 Other voice and resonance disorders: Secondary | ICD-10-CM | POA: Insufficient documentation

## 2014-01-16 DIAGNOSIS — Z79899 Other long term (current) drug therapy: Secondary | ICD-10-CM | POA: Insufficient documentation

## 2014-01-16 DIAGNOSIS — Z09 Encounter for follow-up examination after completed treatment for conditions other than malignant neoplasm: Secondary | ICD-10-CM | POA: Insufficient documentation

## 2014-01-16 DIAGNOSIS — E669 Obesity, unspecified: Secondary | ICD-10-CM | POA: Insufficient documentation

## 2014-01-16 DIAGNOSIS — I517 Cardiomegaly: Secondary | ICD-10-CM | POA: Insufficient documentation

## 2014-01-16 DIAGNOSIS — R51 Headache: Secondary | ICD-10-CM | POA: Insufficient documentation

## 2014-01-16 DIAGNOSIS — R0602 Shortness of breath: Secondary | ICD-10-CM | POA: Insufficient documentation

## 2014-01-16 DIAGNOSIS — J029 Acute pharyngitis, unspecified: Secondary | ICD-10-CM | POA: Insufficient documentation

## 2014-01-16 NOTE — ED Provider Notes (Signed)
Medical screening examination/treatment/procedure(s) were performed by non-physician practitioner and as supervising physician I was immediately available for consultation/collaboration.   EKG Interpretation None        Junius ArgyleForrest S Braxen Dobek, MD 01/16/14 2103

## 2014-01-16 NOTE — ED Provider Notes (Signed)
CSN: [id number]     Arrival date & time 01/16/14  1034 History  This chart was scribed for Emilia BeckKaitlyn Yovanny Coats, PA working with Junius ArgyleForrest S Harrison, MD by Quintella ReichertMatthew Underwood, ED Scribe. This patient was seen in room TR10C/TR10C and the patient's care was started at 11:09 AM.   Chief Complaint  Patient presents with  . Follow-up    The history is provided by the patient and medical records. No language interpreter was used.    HPI Comments: Frances [name] is a 23 y.o. female who presents to the Emergency Department for recheck of sore throat.  Pt was seen here yesterday for a left-sided sore throat that started the night before, and diagnosed with likely uncomplicated tonsillitis vs. early peritonsillar abscess. She was placed on antibiotics and pain medication and advised to return here today for a recheck.  She was also given a referral for ENT f/u tomorrow if symptoms did not improve.  Pt reports she has improved significantly since yesterday.  She started her antibiotics yesterday and has been using her prescribed pain medication, ibuprofen, and warm salt water gargles with significant relief.  She states her pain and her voice change have both improved.  She denies fever.    Past Medical History  Diagnosis Date  . Complication of anesthesia     with csections, felt like she "couldnt swallow during the delivery of baby"  . No pertinent past medical history     preeclampsia with 2010 pregnancy  . Pregnancy induced hypertension   . Ventricular hypertrophy   . History of pre-eclampsia in prior pregnancy, currently pregnant   . Headache(784.0)   . Obesity (BMI 35.0-39.9 without comorbidity)   . Shortness of breath     sob during the night at times    Past Surgical History  Procedure Laterality Date  . Cesarean section    . Cesarean section N/A 07/14/2013    Procedure: REPEAT CESAREAN SECTION;  Surgeon: Brock Badharles A Harper, MD;  Location: [facility];  Service: Obstetrics;  Laterality: N/A;     Family History  Problem Relation Age of Onset  . Stroke Maternal Uncle   . Diabetes Maternal Uncle   . Diabetes Paternal Grandmother     History  Substance Use Topics  . Smoking status: Current Every Day Smoker -- 0.25 packs/day for 4 years    Types: Cigarettes    Last Attempt to Quit: 11/13/2012  . Smokeless tobacco: Never Used  . Alcohol Use: No     Comment: social    OB History   Grav Para Term Preterm Abortions TAB SAB Ect Mult Living   5 3 3  0 2 1 1   3         Review of Systems  Constitutional: Negative for fever.  HENT: Positive for sore throat (improving) and voice change (improving).   All other systems reviewed and are negative.      Allergies  Penicillins  Home Medications   Current Outpatient Rx  Name  Route  Sig  Dispense  Refill  . erythromycin (E-MYCIN) 500 MG tablet   Oral   Take 0.5 tablets (250 mg total) by mouth 3 (three) times daily.   30 tablet   0   . HYDROcodone-acetaminophen (NORCO/VICODIN) 5-325 MG per tablet   Oral   Take 1-2 tablets by mouth every 4 (four) hours as needed.   12 tablet   0   . norelgestromin-ethinyl estradiol (ORTHO EVRA) 150-35 MCG/24HR transdermal patch   Transdermal  Place 1 patch onto the skin once a week.   3 patch   12    BP 143/73  Pulse 89  Temp(Src) 97.1 F (36.2 C)  Resp 18  SpO2 98%  LMP 01/15/2014  Physical Exam  Nursing note and vitals reviewed. Constitutional: She is oriented to person, place, and time. She appears well-developed and well-nourished. No distress.  HENT:  Head: Normocephalic and atraumatic.  Left tonsillar edema and erythema with exudate.    Eyes: EOM are normal.  Neck: Neck supple. No tracheal deviation present.  Cardiovascular: Normal rate.   Pulmonary/Chest: Effort normal. No respiratory distress.  Musculoskeletal: Normal range of motion.  Lymphadenopathy:    She has cervical adenopathy (left).  Neurological: She is alert and oriented to person, place, and  time.  Skin: Skin is warm and dry.  Psychiatric: She has a normal mood and affect. Her behavior is normal.    ED Course  Procedures (including critical care time)  DIAGNOSTIC STUDIES: Oxygen Saturation is 98% on room air, normal by my interpretation.    COORDINATION OF CARE: 11:12 AM-Discussed treatment plan which includes continuing home treatment and ENT f/u tomorrow with pt at bedside and pt agreed to plan.     Labs Review Labs Reviewed - No data to display  Imaging Review No results found.   EKG Interpretation None      MDM   Final diagnoses:  Follow-up exam    11:16 AM Patient reports improvement from yesterday. Vitals stable and patient afebrile. Patient has ENT follow up for tomorrow in office. Patient advised to return with worsening or concerning symptoms.     I personally performed the services described in this documentation, which was scribed in my presence. The recorded information has been reviewed and is accurate.    Emilia Beck, PA-C 01/16/14 1117

## 2014-01-16 NOTE — ED Notes (Signed)
Pt. Stated, i was told to come back for a recheck of my throat.

## 2014-01-18 LAB — CULTURE, GROUP A STREP

## 2014-05-22 ENCOUNTER — Encounter (HOSPITAL_COMMUNITY): Payer: Self-pay | Admitting: Emergency Medicine

## 2014-05-22 ENCOUNTER — Emergency Department (HOSPITAL_COMMUNITY)
Admission: EM | Admit: 2014-05-22 | Discharge: 2014-05-22 | Disposition: A | Discharge status: Home / Self Care | Payer: Medicaid Other | Attending: Emergency Medicine | Admitting: Emergency Medicine

## 2014-05-22 DIAGNOSIS — J029 Acute pharyngitis, unspecified: Secondary | ICD-10-CM | POA: Diagnosis present

## 2014-05-22 DIAGNOSIS — Z880 Allergy status to penicillin: Secondary | ICD-10-CM | POA: Diagnosis not present

## 2014-05-22 DIAGNOSIS — J36 Peritonsillar abscess: Secondary | ICD-10-CM | POA: Insufficient documentation

## 2014-05-22 DIAGNOSIS — Z79899 Other long term (current) drug therapy: Secondary | ICD-10-CM | POA: Insufficient documentation

## 2014-05-22 DIAGNOSIS — O0910 Supervision of pregnancy with history of ectopic or molar pregnancy, unspecified trimester: Secondary | ICD-10-CM | POA: Diagnosis not present

## 2014-05-22 DIAGNOSIS — F17200 Nicotine dependence, unspecified, uncomplicated: Secondary | ICD-10-CM | POA: Insufficient documentation

## 2014-05-22 DIAGNOSIS — Z8679 Personal history of other diseases of the circulatory system: Secondary | ICD-10-CM | POA: Insufficient documentation

## 2014-05-22 DIAGNOSIS — Z792 Long term (current) use of antibiotics: Secondary | ICD-10-CM | POA: Diagnosis not present

## 2014-05-22 DIAGNOSIS — E669 Obesity, unspecified: Secondary | ICD-10-CM | POA: Insufficient documentation

## 2014-05-22 MED ORDER — CLINDAMYCIN PHOSPHATE 900 MG/50ML IV SOLN
900.0000 mg | Freq: Once | INTRAVENOUS | Status: AC
  Administered 2014-05-22: 900 mg via INTRAVENOUS
  Filled 2014-05-22: qty 50

## 2014-05-22 MED ORDER — DEXAMETHASONE SODIUM PHOSPHATE 10 MG/ML IJ SOLN
20.0000 mg | Freq: Once | INTRAMUSCULAR | Status: DC
  Filled 2014-05-22 (×2): qty 2

## 2014-05-22 MED ORDER — CLINDAMYCIN HCL 150 MG PO CAPS
300.0000 mg | ORAL_CAPSULE | Freq: Three times a day (TID) | ORAL | Status: DC
Start: 2014-05-22 — End: 2014-07-28

## 2014-05-22 MED ORDER — HYDROCODONE-ACETAMINOPHEN 7.5-325 MG/15ML PO SOLN: 15.0000 mL | ORAL | Status: DC | PRN

## 2014-05-22 MED ORDER — DEXAMETHASONE SODIUM PHOSPHATE 10 MG/ML IJ SOLN
20.0000 mg | Freq: Once | INTRAMUSCULAR | Status: AC
  Administered 2014-05-22: 20 mg via INTRAVENOUS

## 2014-05-22 NOTE — ED Provider Notes (Signed)
Care discussed with PA Allyne GeeSanders.  She examined. History reviewed. Seen by ENT for tonsillar swelling last month. This resolved. Now has asymmetric swelling the posterior pharynx. Airway widely patent. He will lie supine without being apprehensive. No stridor. No drooling. Care discussed with ENT. Patient will be seen tomorrow morning. Given steroids and antibiotics here. She is able to take by mouth without difficulty. I think this is appropriate plan. Given careful return precautions including any worsening of symptoms in the interval.  Rolland PorterMark Brithney Bensen, MD 05/22/14 [phone]

## 2014-05-22 NOTE — ED Notes (Signed)
Pt. Sdtated, On Thursday my throat started being scratchy and it just got worse.

## 2014-05-22 NOTE — Discharge Instructions (Signed)
Take the prescribed medication as directed. Follow-up with Dr. Suszanne Connerseoh tomorrow at 1pm. Return to the ED for new or worsening symptoms.

## 2014-05-22 NOTE — ED Provider Notes (Signed)
CSN: [id number]     Arrival date & time 05/22/14  0757 History   First MD Initiated Contact with Patient 05/22/14 0802     Chief Complaint  Patient presents with  . Sore Throat     (Consider location/radiation/quality/duration/timing/severity/associated sxs/prior Treatment) The history is provided by the patient and medical records.   This is a 23 year old female presenting to the ED for sore throat and difficulty swallowing. Patient states on Thursday she began noticing that her throat felt very scratchy, but has since developed significant pain which seems more localized to the left side of her throat. She does endorse some left ear pain as well. Here last night of 102F which was relieved with Tylenol.  States it is extremely painful to swallow, and she has some difficulty with swallowing solid foods. She denies any cough, shortness of breath, rhinorrhea, nasal congestion.  No known sick contacts.  She was seen by ENT last month, Dr. Suszanne Connerseoh, for tonsillitis and persistent swelling.  States she does not feel that sx ever fully resolved.  VS stable on arrival.  Past Medical History  Diagnosis Date  . Complication of anesthesia     with csections, felt like she "couldnt swallow during the delivery of baby"  . No pertinent past medical history     preeclampsia with 2010 pregnancy  . Pregnancy induced hypertension   . Ventricular hypertrophy   . History of pre-eclampsia in prior pregnancy, currently pregnant   . Headache(784.0)   . Obesity (BMI 35.0-39.9 without comorbidity)   . Shortness of breath     sob during the night at times   Past Surgical History  Procedure Laterality Date  . Cesarean section    . Cesarean section N/A 07/14/2013    Procedure: REPEAT CESAREAN SECTION;  Surgeon: Brock Badharles A Harper, MD;  Location: [facility];  Service: Obstetrics;  Laterality: N/A;   Family History  Problem Relation Age of Onset  . Stroke Maternal Uncle   . Diabetes Maternal Uncle   . Diabetes Paternal  Grandmother    History  Substance Use Topics  . Smoking status: Current Every Day Smoker -- 0.25 packs/day for 4 years    Types: Cigarettes    Last Attempt to Quit: 11/13/2012  . Smokeless tobacco: Never Used  . Alcohol Use: No     Comment: social   OB History   Grav Para Term Preterm Abortions TAB SAB Ect Mult Living   5 3 3  0 2 1 1   3      Review of Systems  HENT: Positive for sore throat.   All other systems reviewed and are negative.     Allergies  Penicillins  Home Medications   Prior to Admission medications   Medication Sig Start Date End Date Taking? Authorizing Provider  erythromycin (E-MYCIN) 500 MG tablet Take 0.5 tablets (250 mg total) by mouth 3 (three) times daily. 01/15/14   Shari A Upstill, PA-C  HYDROcodone-acetaminophen (NORCO/VICODIN) 5-325 MG per tablet Take 1-2 tablets by mouth every 4 (four) hours as needed. 01/15/14   Shari A Upstill, PA-C  norelgestromin-ethinyl estradiol (ORTHO EVRA) 150-35 MCG/24HR transdermal patch Place 1 patch onto the skin once a week. 09/16/13   Brock Badharles A Harper, MD   BP 131/72  Pulse 107  Temp(Src) 100.1 F (37.8 C) (Oral)  Resp 18  SpO2 98%  LMP 05/20/2014  Physical Exam  Nursing note and vitals reviewed. Constitutional: She is oriented to person, place, and time. She appears well-developed and well-nourished.  HENT:  Head: Normocephalic and atraumatic.  Right Ear: Tympanic membrane and ear canal normal.  Left Ear: Tympanic membrane and ear canal normal.  Nose: Nose normal.  Mouth/Throat: Mucous membranes are normal. Posterior oropharyngeal erythema and tonsillar abscesses present. No oropharyngeal exudate or posterior oropharyngeal edema.  Tonsils asymmetric, left sided tonsillar edema with uvular deviation to right consistent with peritonsillar abscess; handling secretions appropriately; speaking in full sentences without difficulty  Eyes: Conjunctivae and EOM are normal. Pupils are equal, round, and reactive to light.   Neck: Normal range of motion. Neck supple.  Cardiovascular: Normal rate, regular rhythm and normal heart sounds.   Pulmonary/Chest: Effort normal and breath sounds normal. No respiratory distress. She has no wheezes.  Musculoskeletal: Normal range of motion.  Neurological: She is alert and oriented to person, place, and time.  Skin: Skin is warm and dry.  Psychiatric: She has a normal mood and affect.    ED Course  Procedures (including critical care time) Labs Review Labs Reviewed - No data to display  Imaging Review No results found.   EKG Interpretation None      MDM   Final diagnoses:  Peritonsillar abscess   Patient's exam consistent with left peritonsillar abscess. She is currently handling her secretions appropriately and has no respiratory distress. Will consult ENT.  Discussed case with Dr. Suszanne Conners-- recommended IV clindamycin 900mg  and Decadron 20mg , office FU tomorrow at 1pm.  Meds given, pt drinking apple juice in ED without difficulty.  Airway remains patent.  D/c home with clindamycin and hycet, FU tomorrow as instructed.  Discussed plan with patient, he/she acknowledged understanding and agreed with plan of care.  Return precautions given for new or worsening symptoms.  Garlon Hatchet, PA-C 05/22/14 1032

## 2014-05-22 NOTE — ED Notes (Signed)
Declined W/C at D/C and was escorted to lobby by RN. 

## 2014-05-30 NOTE — ED Provider Notes (Signed)
Medical screening examination/treatment/procedure(s) were performed by non-physician practitioner and as supervising physician I was immediately available for consultation/collaboration.   EKG Interpretation None        Kyiesha Millward, MD 05/30/14 1511 

## 2014-07-28 ENCOUNTER — Encounter (HOSPITAL_COMMUNITY): Payer: Self-pay | Admitting: Emergency Medicine

## 2014-07-28 ENCOUNTER — Emergency Department (HOSPITAL_COMMUNITY)
Admission: EM | Admit: 2014-07-28 | Discharge: 2014-07-28 | Disposition: A | Discharge status: Home / Self Care | Payer: Medicaid Other | Attending: Emergency Medicine | Admitting: Emergency Medicine

## 2014-07-28 DIAGNOSIS — J029 Acute pharyngitis, unspecified: Secondary | ICD-10-CM | POA: Diagnosis not present

## 2014-07-28 DIAGNOSIS — F17200 Nicotine dependence, unspecified, uncomplicated: Secondary | ICD-10-CM | POA: Diagnosis not present

## 2014-07-28 DIAGNOSIS — B309 Viral conjunctivitis, unspecified: Secondary | ICD-10-CM | POA: Diagnosis not present

## 2014-07-28 DIAGNOSIS — H9209 Otalgia, unspecified ear: Secondary | ICD-10-CM | POA: Diagnosis not present

## 2014-07-28 DIAGNOSIS — Z880 Allergy status to penicillin: Secondary | ICD-10-CM | POA: Insufficient documentation

## 2014-07-28 DIAGNOSIS — H579 Unspecified disorder of eye and adnexa: Secondary | ICD-10-CM | POA: Insufficient documentation

## 2014-07-28 DIAGNOSIS — H5789 Other specified disorders of eye and adnexa: Secondary | ICD-10-CM | POA: Insufficient documentation

## 2014-07-28 DIAGNOSIS — Z8679 Personal history of other diseases of the circulatory system: Secondary | ICD-10-CM | POA: Diagnosis not present

## 2014-07-28 DIAGNOSIS — E669 Obesity, unspecified: Secondary | ICD-10-CM | POA: Diagnosis not present

## 2014-07-28 MED ORDER — ERYTHROMYCIN 5 MG/GM OP OINT: 1.0000 "application " | TOPICAL_OINTMENT | Freq: Four times a day (QID) | OPHTHALMIC | Status: DC

## 2014-07-28 MED ORDER — NAPHAZOLINE HCL 0.1 % OP SOLN
1.0000 [drp] | Freq: Four times a day (QID) | OPHTHALMIC | Status: DC | PRN
  Administered 2014-07-28: 1 [drp] via OPHTHALMIC
  Filled 2014-07-28: qty 15

## 2014-07-28 MED ORDER — FLUORESCEIN SODIUM 1 MG OP STRP
1.0000 | ORAL_STRIP | Freq: Once | OPHTHALMIC | Status: AC
  Administered 2014-07-28: 1 via OPHTHALMIC
  Filled 2014-07-28: qty 1

## 2014-07-28 MED ORDER — TETRACAINE HCL 0.5 % OP SOLN
1.0000 [drp] | Freq: Once | OPHTHALMIC | Status: AC
  Administered 2014-07-28: 1 [drp] via OPHTHALMIC
  Filled 2014-07-28: qty 2

## 2014-07-28 NOTE — Discharge Instructions (Signed)
Viral Conjunctivitis Conjunctivitis is an irritation (inflammation) of the clear membrane that covers the white part of the eye (the conjunctiva). The irritation can also happen on the underside of the eyelids. Conjunctivitis makes the eye red or pink in color. This is what is commonly known as pink eye. Viral conjunctivitis can spread easily (contagious). CAUSES   Infection from virus on the surface of the eye.  Infection from the irritation or injury of nearby tissues such as the eyelids or cornea.  More serious inflammation or infection on the inside of the eye.  Other eye diseases.  The use of certain eye medications. SYMPTOMS  The normally white color of the eye or the underside of the eyelid is usually pink or red in color. The pink eye is usually associated with irritation, tearing and some sensitivity to light. Viral conjunctivitis is often associated with a clear, watery discharge. If a discharge is present, there may also be some blurred vision in the affected eye. DIAGNOSIS  Conjunctivitis is diagnosed by an eye exam. The eye specialist looks for changes in the surface tissues of the eye which take on changes characteristic of the specific types of conjunctivitis. A sample of any discharge may be collected on a Q-Tip (sterile swap). The sample will be sent to a lab to see whether or not the inflammation is caused by bacterial or viral infection. TREATMENT  Viral conjunctivitis will not respond to medicines that kill germs (antibiotics). Treatment is aimed at stopping a bacterial infection on top of the viral infection. The goal of treatment is to relieve symptoms (such as itching) with antihistamine drops or other eye medications.  HOME CARE INSTRUCTIONS   To ease discomfort, apply a cool, clean wash cloth to your eye for 10 to 20 minutes, 3 to 4 times a day.  Gently wipe away any drainage from the eye with a warm, wet washcloth or a cotton ball.  Wash your hands often with soap  and use paper towels to dry.  Do not share towels or washcloths. This may spread the infection.  Change or wash your pillowcase every day.  You should not use eye make-up until the infection is gone.  Stop using contacts lenses. Ask your eye professional how to sterilize or replace them before using again. This depends on the type of contact lenses used.  Do not touch the edge of the eyelid with the eye drop bottle or ointment tube when applying medications to the affected eye. This will stop you from spreading the infection to the other eye or to others. SEEK IMMEDIATE MEDICAL CARE IF:   The infection has not improved within 3 days of beginning treatment.  A watery discharge from the eye develops.  Pain in the eye increases.  The redness is spreading.  Vision becomes blurred.  An oral temperature above 102 F (38.9 C) develops, or as your caregiver suggests.  Facial pain, redness or swelling develops.  Any problems that may be related to the prescribed medicine develop. MAKE SURE YOU:   Understand these instructions.  Will watch your condition.  Will get help right away if you are not doing well or get worse. Document Released: 10/28/2005 Document Revised: 01/20/2012 Document Reviewed: 06/16/2008 Exit[employer] Patient Information 2015 [employer]. This information is not intended to replace advice given to you by your health care provider. Make sure you discuss any questions you have with your health care provider.  

## 2014-07-28 NOTE — ED Provider Notes (Signed)
CSN: [id number]     Arrival date & time 07/28/14  1301 History  This chart was scribed for non-physician practitioner Junious Silk, PA-C working with Gerhard Munch, MD by Annye Asa, ED Scribe. This patient was seen in room TR08C/TR08C and the patient's care was started at 2:40 PM.     Chief Complaint  Patient presents with  . Eye Problem   HPI  HPI Comments: Frances [name] is a 23 y.o. female who presents to the Emergency Department complaining of itchiness in the left eye with associated drainage and redness since yesterday. She reports associated sore throat and otalgia. She states that her vision was blurry yesterday due to drainage. Patient does not wear contacts. She denies sick contacts; notes that she has three kids. Patient denies fever, rhinorrhea.    Past Medical History  Diagnosis Date  . Complication of anesthesia     with csections, felt like she "couldnt swallow during the delivery of baby"  . No pertinent past medical history     preeclampsia with 2010 pregnancy  . Pregnancy induced hypertension   . Ventricular hypertrophy   . History of pre-eclampsia in prior pregnancy, currently pregnant   . Headache(784.0)   . Obesity (BMI 35.0-39.9 without comorbidity)   . Shortness of breath     sob during the night at times   Past Surgical History  Procedure Laterality Date  . Cesarean section    . Cesarean section N/A 07/14/2013    Procedure: REPEAT CESAREAN SECTION;  Surgeon: Brock Bad, MD;  Location: [facility];  Service: Obstetrics;  Laterality: N/A;   Family History  Problem Relation Age of Onset  . Stroke Maternal Uncle   . Diabetes Maternal Uncle   . Diabetes Paternal Grandmother    History  Substance Use Topics  . Smoking status: Current Every Day Smoker -- 0.25 packs/day for 4 years    Types: Cigarettes    Last Attempt to Quit: 11/13/2012  . Smokeless tobacco: Never Used  . Alcohol Use: No     Comment: social   OB History   Grav Para Term  Preterm Abortions TAB SAB Ect Mult Living   0 Review of Systems  Constitutional: Negative for fever and chills.  HENT: Positive for ear pain and sore throat. Negative for rhinorrhea.   Eyes: Positive for discharge, redness, itching and visual disturbance.  All other systems reviewed and are negative.   Allergies  Penicillins  Home Medications   Prior to Admission medications   Not on File   BP 133/87  Pulse 62  Temp(Src) 97.3 F (36.3 C) (Oral)  Resp 16  SpO2 100%  LMP 07/21/2014 Physical Exam  Nursing note and vitals reviewed. Constitutional: She is oriented to person, place, and time. She appears well-developed and well-nourished. No distress.  HENT:  Head: Normocephalic and atraumatic.  Right Ear: External ear normal.  Left Ear: External ear normal.  Nose: Nose normal.  Mouth/Throat: Oropharynx is clear and moist.  Eyes: EOM are normal. Right eye exhibits no discharge and no exudate. Left eye exhibits no discharge and no exudate. Right conjunctiva is not injected. Left conjunctiva is injected.  Slit lamp exam:      The left eye shows no corneal abrasion, no corneal flare, no corneal ulcer, no foreign body, no hyphema, no hypopyon and no fluorescein uptake.  No discharge at this time.  Neck: Normal range of motion.  Cardiovascular:  Normal rate, regular rhythm and normal heart sounds.   Pulmonary/Chest: Effort normal and breath sounds normal. No stridor. No respiratory distress. She has no wheezes. She has no rales.  Abdominal: Soft. She exhibits no distension.  Musculoskeletal: Normal range of motion.  Neurological: She is alert and oriented to person, place, and time. She has normal strength.  Skin: Skin is warm and dry. She is not diaphoretic. No erythema.  Psychiatric: She has a normal mood and affect. Her behavior is normal.    ED Course  Procedures  DIAGNOSTIC STUDIES: Oxygen Saturation is 97% on RA, adequate by my interpretation.     COORDINATION OF CARE: 2:09 PM Discussed treatment plan with pt at bedside; patient will receive an acuity exam and fluorescein uptake exam and pt agreed to plan.   Labs Review Labs Reviewed - No data to display  Imaging Review No results found.   EKG Interpretation None      MDM   Final diagnoses:  Viral conjunctivitis   Patient presentation consistent with viral conjunctivitis.  No purulent discharge, corneal abrasions, entrapment, consensual photophobia, or dendritic staining with fluorescein study.  Presentation non-concerning for iritis, bacterial conjunctivitis, corneal abrasions, or HSV.  Patient given naphazoline for itching.  Personal hygiene and frequent handwashing discussed.  Patient advised to followup with ophthalmologist if symptoms persist or worsen in any way including vision change or purulent discharge.  Patient verbalizes understanding and is agreeable with discharge.   I personally performed the services described in this documentation, which was scribed in my presence. The recorded information has been reviewed and is accurate.       Mora Bellman, PA-C 07/28/14 [phone]

## 2014-07-28 NOTE — ED Notes (Signed)
Pharmacy contacted. Med will be sent

## 2014-07-28 NOTE — ED Notes (Signed)
Pt reports left eye redness, yellow drainage and itching since yesterday. Hx of pink eye and states this feels the same. Reports mild blurred vision "because I keep seeing all the yellow stuff." NAD.

## 2014-07-28 NOTE — ED Notes (Signed)
Patient states she "is supposed to wear glasses, but I haven't worn them in years".

## 2014-07-29 NOTE — ED Provider Notes (Signed)
Medical screening examination/treatment/procedure(s) were performed by non-physician practitioner and as supervising physician I was immediately available for consultation/collaboration.  Hung Rhinesmith, MD 07/29/14 0715 

## 2014-08-21 ENCOUNTER — Other Ambulatory Visit: Payer: Self-pay | Admitting: Obstetrics

## 2014-08-22 ENCOUNTER — Other Ambulatory Visit: Payer: Self-pay | Admitting: Obstetrics

## 2014-09-12 ENCOUNTER — Encounter (HOSPITAL_COMMUNITY): Payer: Self-pay | Admitting: Emergency Medicine

## 2014-09-22 ENCOUNTER — Encounter: Payer: Self-pay | Admitting: Obstetrics & Gynecology

## 2014-09-22 ENCOUNTER — Ambulatory Visit (INDEPENDENT_AMBULATORY_CARE_PROVIDER_SITE_OTHER): Payer: Medicaid Other | Admitting: Obstetrics & Gynecology

## 2014-09-22 VITALS — BP 144/84 | HR 102 | Ht 69.0 in | Wt 338.0 lb

## 2014-09-22 DIAGNOSIS — Z01419 Encounter for gynecological examination (general) (routine) without abnormal findings: Secondary | ICD-10-CM

## 2014-09-22 DIAGNOSIS — Z0000 Encounter for general adult medical examination without abnormal findings: Secondary | ICD-10-CM

## 2014-09-22 LAB — CBC
HEMATOCRIT: 36.2 % (ref 36.0–46.0)
Hemoglobin: 12.2 g/dL (ref 12.0–15.0)
MCH: 28.6 pg (ref 26.0–34.0)
MCHC: 33.7 g/dL (ref 30.0–36.0)
MCV: 85 fL (ref 78.0–100.0)
Platelets: 310 10*3/uL (ref 150–400)
RBC: 4.26 MIL/uL (ref 3.87–5.11)
RDW: 14.7 % (ref 11.5–15.5)
WBC: 10.9 10*3/uL — AB (ref 4.0–10.5)

## 2014-09-22 MED ORDER — NORELGESTROMIN-ETH ESTRADIOL 150-35 MCG/24HR TD PTWK: 1.0000 | MEDICATED_PATCH | TRANSDERMAL | Status: DC

## 2014-09-22 NOTE — Progress Notes (Signed)
Subjective:     Frances [name] is a 23 y.o. female here for a routine exam.     Personal health questionnaire:  Is patient Frances [name], have a family history of breast and/or ovarian cancer: no Is there a family history of uterine cancer diagnosed at age < 550, gastrointestinal cancer, urinary tract cancer, family member who is a Personnel officerLynch syndrome-associated carrier: no Is the patient overweight and hypertensive, family history of diabetes, personal history of gestational diabetes or PCOS: yes Is patient over 3355, have PCOS,  family history of premature CHD under age 25, diabetes, smoke, have hypertension or peripheral artery disease:  no At any time, has a partner hit, kicked or otherwise hurt or frightened you?: no Over the past 2 weeks, have you felt down, depressed or hopeless?: no Over the past 2 weeks, have you felt little interest or pleasure in doing things?:no   Gynecologic History Patient's last menstrual period was 09/06/2014. Contraception: Ortho-Evra patches weekly Last Pap: 2014. Results were: ASCUS   Obstetric History OB History  Gravida Para Term Preterm AB SAB TAB Ectopic Multiple Living  5 3 3  0 2 1 1   3     # Outcome Date GA Lbr Len/2nd Weight Sex Delivery Anes PTL Lv  5 Term 07/14/13 183w5d  3.91 kg (8 lb 9.9 oz) M CS-LTranv Spinal  Y  4 Term 2011 3554w0d    CS-Unspec     3 Term 2010 2054w0d    CS-Unspec     2 TAB 2005          1 SAB               Past Medical History  Diagnosis Date  . Complication of anesthesia     with csections, felt like she "couldnt swallow during the delivery of baby"  . No pertinent past medical history     preeclampsia with 2010 pregnancy  . Pregnancy induced hypertension   . Ventricular hypertrophy   . History of pre-eclampsia in prior pregnancy, currently pregnant   . Headache(784.0)   . Obesity (BMI 35.0-39.9 without comorbidity)   . Shortness of breath     sob during the night at times    Past Surgical History  Procedure  Laterality Date  . Cesarean section    . Cesarean section N/A 07/14/2013    Procedure: REPEAT CESAREAN SECTION;  Surgeon: Brock Badharles A Harper, MD;  Location: [facility];  Service: Obstetrics;  Laterality: N/A;    Current outpatient prescriptions: erythromycin ophthalmic ointment, Place 1 application into the left eye 4 (four) times daily. Place 1/2 inch ribbon of ointment in the affected eye 4 times a day, Disp: 1 g, Rfl: 1;  norelgestromin-ethinyl estradiol (XULANE) 150-35 MCG/24HR transdermal patch, Place 1 patch onto the skin once a week., Disp: 12 patch, Rfl: 3 Allergies  Allergen Reactions  . Penicillins Hives    History  Substance Use Topics  . Smoking status: Current Every Day Smoker -- 0.25 packs/day for 4 years    Types: Cigarettes    Last Attempt to Quit: 11/13/2012  . Smokeless tobacco: Never Used  . Alcohol Use: No     Comment: social    Family History  Problem Relation Age of Onset  . Stroke Maternal Uncle   . Diabetes Maternal Uncle   . Diabetes Paternal Grandmother       Review of Systems  Constitutional: negative for fatigue and weight loss Respiratory: negative for cough and wheezing Cardiovascular: negative for chest pain,  fatigue and palpitations Gastrointestinal: negative for abdominal pain and change in bowel habits Musculoskeletal:negative for myalgias Neurological: negative for gait problems and tremors Behavioral/Psych: negative for abusive relationship, depression Endocrine: negative for temperature intolerance   Genitourinary:negative for abnormal menstrual periods, genital lesions, hot flashes, sexual problems and vaginal discharge Integument/breast: negative for breast lump, breast tenderness, nipple discharge and skin lesion(s)    Objective:       BP 144/84 mmHg  Pulse 102  Ht 5\' 9"  (1.753 m)  Wt 153.316 kg (338 lb)  BMI 49.89 kg/m2  LMP 09/06/2014 General:   alert  Skin:   no rash or abnormalities  Lungs:   clear to auscultation bilaterally   Heart:   regular rate and rhythm, S1, S2 normal, no murmur, click, rub or gallop  Breasts:   normal without suspicious masses, skin or nipple changes or axillary nodes  Abdomen:  normal findings: no organomegaly, soft, non-tender and no hernia  Pelvis:  External genitalia: normal general appearance Urinary system: urethral meatus normal and bladder without fullness, nontender Vaginal: normal without tenderness, induration or masses Cervix: normal appearance Adnexa: normal bimanual exam Uterus: anteverted and non-tender, normal size   Lab Review  Labs reviewed yes Radiologic studies reviewed no    Assessment:    Healthy female exam.  H/O ASCUS Pap smear  Plan:    Education reviewed: calcium supplements, low fat, low cholesterol diet, safe sex/STD prevention, weight bearing exercise and HPV vaccine.   Meds ordered this encounter  Medications  . norelgestromin-ethinyl estradiol Burr Medico(XULANE) 150-35 MCG/24HR transdermal patch    Sig: Place 1 patch onto the skin once a week.    Dispense:  12 patch    Refill:  3    **Patient requests 90 days supply**   Orders Placed This Encounter  Procedures  . GC/Chlamydia Probe Amp  . HIV antibody  . RPR  . CBC  . Hemoglobin A1c   Check Pap smear from today--may need a colposcopic exam if abnormal Follow up as needed.

## 2014-09-22 NOTE — Patient Instructions (Signed)
Varenicline oral tablets What is this medicine? VARENICLINE (var EN i kleen) is used to help people quit smoking. It can reduce the symptoms caused by stopping smoking. It is used with a patient support program recommended by your physician. This medicine may be used for other purposes; ask your health care provider or pharmacist if you have questions. COMMON BRAND NAME(S): Chantix What should I tell my health care provider before I take this medicine? They need to know if you have any of these conditions: -bipolar disorder, depression, schizophrenia or other mental illness -heart disease -if you often drink alcohol -kidney disease -peripheral vascular disease -seizures -stroke -suicidal thoughts, plans, or attempt; a previous suicide attempt by you or a family member -an unusual or allergic reaction to varenicline, other medicines, foods, dyes, or preservatives -pregnant or trying to get pregnant -breast-feeding How should I use this medicine? You should set a date to stop smoking and tell your doctor. Start this medicine one week before the quit date. You can also start taking this medicine before you choose a quit date, and then pick a quit date that is between 8 and 35 days of treatment with this medicine. Stick to your plan; ask about support groups or other ways to help you remain a 'quitter'. Take this medicine by mouth after eating. Take with a full glass of water. Follow the directions on the prescription label. Take your doses at regular intervals. Do not take your medicine more often than directed. A special MedGuide will be given to you by the pharmacist with each prescription and refill. Be sure to read this information carefully each time. Talk to your pediatrician regarding the use of this medicine in children. This medicine is not approved for use in children. Overdosage: If you think you have taken too much of this medicine contact a poison control center or emergency room at  once. NOTE: This medicine is only for you. Do not share this medicine with others. What if I miss a dose? If you miss a dose, take it as soon as you can. If it is almost time for your next dose, take only that dose. Do not take double or extra doses. What may interact with this medicine? -alcohol or any product that contains alcohol -insulin -other stop smoking aids -theophylline -warfarin This list may not describe all possible interactions. Give your health care provider a list of all the medicines, herbs, non-prescription drugs, or dietary supplements you use. Also tell them if you smoke, drink alcohol, or use illegal drugs. Some items may interact with your medicine. What should I watch for while using this medicine? Visit your doctor or health care professional for regular check ups. Ask for ongoing advice and encouragement from your doctor or healthcare professional, friends, and family to help you quit. If you smoke while on this medication, quit again Your mouth may get dry. Chewing sugarless gum or sucking hard candy, and drinking plenty of water may help. Contact your doctor if the problem does not go away or is severe. You may get drowsy or dizzy. Do not drive, use machinery, or do anything that needs mental alertness until you know how this medicine affects you. Do not stand or sit up quickly, especially if you are an older patient. This reduces the risk of dizzy or fainting spells. The use of this medicine may increase the chance of suicidal thoughts or actions. Pay special attention to how you are responding while on this medicine. Any worsening of   mood, or thoughts of suicide or dying should be reported to your health care professional right away. What side effects may I notice from receiving this medicine? Side effects that you should report to your doctor or health care professional as soon as possible: -allergic reactions like skin rash, itching or hives, swelling of the face,  lips, tongue, or throat -breathing problems -changes in vision -chest pain or chest tightness -confusion, trouble speaking or understanding -fast, irregular heartbeat -feeling faint or lightheaded, falls -fever -pain in legs when walking -problems with balance, talking, walking -redness, blistering, peeling or loosening of the skin, including inside the mouth -ringing in ears -seizures -sudden numbness or weakness of the face, arm or leg -suicidal thoughts or other mood changes -trouble passing urine or change in the amount of urine -unusual bleeding or bruising -unusually weak or tired Side effects that usually do not require medical attention (report to your doctor or health care professional if they continue or are bothersome): -constipation -headache -nausea, vomiting -strange dreams -stomach gas -trouble sleeping This list may not describe all possible side effects. Call your doctor for medical advice about side effects. You may report side effects to FDA at 1-800-FDA-1088. Where should I keep my medicine? Keep out of the reach of children. Store at room temperature between 15 and 30 degrees C (59 and 86 degrees F). Throw away any unused medicine after the expiration date. NOTE: This sheet is a summary. It may not cover all possible information. If you have questions about this medicine, talk to your doctor, pharmacist, or health care provider.  2015, Elsevier/Gold Standard. (2013-08-09 13:37:47) Varenicline oral tablets What is this medicine? VARENICLINE (var EN i kleen) is used to help people quit smoking. It can reduce the symptoms caused by stopping smoking. It is used with a patient support program recommended by your physician. This medicine may be used for other purposes; ask your health care provider or pharmacist if you have questions. COMMON BRAND NAME(S): Chantix What should I tell my health care provider before I take this medicine? They need to know if you have  any of these conditions: -bipolar disorder, depression, schizophrenia or other mental illness -heart disease -if you often drink alcohol -kidney disease -peripheral vascular disease -seizures -stroke -suicidal thoughts, plans, or attempt; a previous suicide attempt by you or a family member -an unusual or allergic reaction to varenicline, other medicines, foods, dyes, or preservatives -pregnant or trying to get pregnant -breast-feeding How should I use this medicine? You should set a date to stop smoking and tell your doctor. Start this medicine one week before the quit date. You can also start taking this medicine before you choose a quit date, and then pick a quit date that is between 8 and 35 days of treatment with this medicine. Stick to your plan; ask about support groups or other ways to help you remain a 'quitter'. Take this medicine by mouth after eating. Take with a full glass of water. Follow the directions on the prescription label. Take your doses at regular intervals. Do not take your medicine more often than directed. A special MedGuide will be given to you by the pharmacist with each prescription and refill. Be sure to read this information carefully each time. Talk to your pediatrician regarding the use of this medicine in children. This medicine is not approved for use in children. Overdosage: If you think you have taken too much of this medicine contact a poison control center or emergency room at   once. NOTE: This medicine is only for you. Do not share this medicine with others. What if I miss a dose? If you miss a dose, take it as soon as you can. If it is almost time for your next dose, take only that dose. Do not take double or extra doses. What may interact with this medicine? -alcohol or any product that contains alcohol -insulin -other stop smoking aids -theophylline -warfarin This list may not describe all possible interactions. Give your health care provider a  list of all the medicines, herbs, non-prescription drugs, or dietary supplements you use. Also tell them if you smoke, drink alcohol, or use illegal drugs. Some items may interact with your medicine. What should I watch for while using this medicine? Visit your doctor or health care professional for regular check ups. Ask for ongoing advice and encouragement from your doctor or healthcare professional, friends, and family to help you quit. If you smoke while on this medication, quit again Your mouth may get dry. Chewing sugarless gum or sucking hard candy, and drinking plenty of water may help. Contact your doctor if the problem does not go away or is severe. You may get drowsy or dizzy. Do not drive, use machinery, or do anything that needs mental alertness until you know how this medicine affects you. Do not stand or sit up quickly, especially if you are an older patient. This reduces the risk of dizzy or fainting spells. The use of this medicine may increase the chance of suicidal thoughts or actions. Pay special attention to how you are responding while on this medicine. Any worsening of mood, or thoughts of suicide or dying should be reported to your health care professional right away. What side effects may I notice from receiving this medicine? Side effects that you should report to your doctor or health care professional as soon as possible: -allergic reactions like skin rash, itching or hives, swelling of the face, lips, tongue, or throat -breathing problems -changes in vision -chest pain or chest tightness -confusion, trouble speaking or understanding -fast, irregular heartbeat -feeling faint or lightheaded, falls -fever -pain in legs when walking -problems with balance, talking, walking -redness, blistering, peeling or loosening of the skin, including inside the mouth -ringing in ears -seizures -sudden numbness or weakness of the face, arm or leg -suicidal thoughts or other mood  changes -trouble passing urine or change in the amount of urine -unusual bleeding or bruising -unusually weak or tired Side effects that usually do not require medical attention (report to your doctor or health care professional if they continue or are bothersome): -constipation -headache -nausea, vomiting -strange dreams -stomach gas -trouble sleeping This list may not describe all possible side effects. Call your doctor for medical advice about side effects. You may report side effects to FDA at 1-800-FDA-1088. Where should I keep my medicine? Keep out of the reach of children. Store at room temperature between 15 and 30 degrees C (59 and 86 degrees F). Throw away any unused medicine after the expiration date. NOTE: This sheet is a summary. It may not cover all possible information. If you have questions about this medicine, talk to your doctor, pharmacist, or health care provider.  2015, [employer]vier/Gold Standard. (2013-08-09 13:37:47)  

## 2014-09-23 LAB — RPR

## 2014-09-23 LAB — HEMOGLOBIN A1C
Hgb A1c MFr Bld: 5.9 % — ABNORMAL HIGH (ref <5.7)
Mean Plasma Glucose: 123 mg/dL — ABNORMAL HIGH (ref <117)

## 2014-09-23 LAB — HIV ANTIBODY (ROUTINE TESTING W REFLEX): HIV 1&2 Ab, 4th Generation: NONREACTIVE

## 2014-09-23 LAB — GC/CHLAMYDIA PROBE AMP
CT Probe RNA: NEGATIVE
GC Probe RNA: NEGATIVE

## 2014-09-28 ENCOUNTER — Encounter: Payer: Self-pay | Admitting: Obstetrics & Gynecology

## 2014-09-28 DIAGNOSIS — R7303 Prediabetes: Secondary | ICD-10-CM | POA: Insufficient documentation

## 2014-10-12 ENCOUNTER — Other Ambulatory Visit: Payer: Self-pay | Admitting: *Deleted

## 2014-10-12 DIAGNOSIS — Z3049 Encounter for surveillance of other contraceptives: Secondary | ICD-10-CM

## 2014-10-12 MED ORDER — NORELGESTROMIN-ETH ESTRADIOL 150-35 MCG/24HR TD PTWK: 1.0000 | MEDICATED_PATCH | TRANSDERMAL | Status: DC

## 2014-11-07 ENCOUNTER — Encounter: Payer: Self-pay | Admitting: *Deleted

## 2014-11-08 ENCOUNTER — Encounter: Payer: Self-pay | Admitting: Obstetrics & Gynecology

## 2015-01-20 ENCOUNTER — Encounter (HOSPITAL_COMMUNITY): Payer: Self-pay | Admitting: Emergency Medicine

## 2015-01-20 ENCOUNTER — Emergency Department (HOSPITAL_COMMUNITY)
Admission: EM | Admit: 2015-01-20 | Discharge: 2015-01-20 | Disposition: A | Discharge status: Home / Self Care | Payer: Medicaid Other | Attending: Emergency Medicine | Admitting: Emergency Medicine

## 2015-01-20 ENCOUNTER — Telehealth: Payer: Self-pay | Admitting: *Deleted

## 2015-01-20 DIAGNOSIS — Z720 Tobacco use: Secondary | ICD-10-CM | POA: Diagnosis not present

## 2015-01-20 DIAGNOSIS — Z8679 Personal history of other diseases of the circulatory system: Secondary | ICD-10-CM | POA: Diagnosis not present

## 2015-01-20 DIAGNOSIS — R509 Fever, unspecified: Secondary | ICD-10-CM | POA: Diagnosis present

## 2015-01-20 DIAGNOSIS — J020 Streptococcal pharyngitis: Secondary | ICD-10-CM | POA: Insufficient documentation

## 2015-01-20 DIAGNOSIS — E669 Obesity, unspecified: Secondary | ICD-10-CM | POA: Diagnosis not present

## 2015-01-20 DIAGNOSIS — Z880 Allergy status to penicillin: Secondary | ICD-10-CM | POA: Diagnosis not present

## 2015-01-20 LAB — RAPID STREP SCREEN (MED CTR MEBANE ONLY): Streptococcus, Group A Screen (Direct): POSITIVE — AB

## 2015-01-20 MED ORDER — IBUPROFEN 800 MG PO TABS
800.0000 mg | ORAL_TABLET | Freq: Once | ORAL | Status: AC
  Administered 2015-01-20: 800 mg via ORAL
  Filled 2015-01-20: qty 1

## 2015-01-20 MED ORDER — AZITHROMYCIN 250 MG PO TABS: 250.0000 mg | ORAL_TABLET | Freq: Every day | ORAL | Status: DC

## 2015-01-20 NOTE — Discharge Instructions (Signed)

## 2015-01-20 NOTE — Telephone Encounter (Signed)
Pharmacy misplaced prescription; called for verbal order.

## 2015-01-20 NOTE — ED Provider Notes (Signed)
CSN: [id number]     Arrival date & time 01/20/15  0825 History   First MD Initiated Contact with Patient 01/20/15 [phone]     Chief Complaint  Patient presents with  . flu symptoms      (Consider location/radiation/quality/duration/timing/severity/associated sxs/prior Treatment) HPI Comments: Pt comes in with c/o fever and myalgias times 2 days. Fever as high as 103. Has had tylenol and motrin for the symptoms. Does have a sore throat. No n/v/d, neck stiffness, no cough. Pt states that both child had similar symptoms and they were tested for flu which was negative. Does have chills.  The history is provided by the patient. No language interpreter was used.    Past Medical History  Diagnosis Date  . Complication of anesthesia     with csections, felt like she "couldnt swallow during the delivery of baby"  . No pertinent past medical history     preeclampsia with 2010 pregnancy  . Pregnancy induced hypertension   . Ventricular hypertrophy   . History of pre-eclampsia in prior pregnancy, currently pregnant   . Headache(784.0)   . Obesity (BMI 35.0-39.9 without comorbidity)   . Shortness of breath     sob during the night at times   Past Surgical History  Procedure Laterality Date  . Cesarean section    . Cesarean section N/A 07/14/2013    Procedure: REPEAT CESAREAN SECTION;  Surgeon: Brock Badharles A Harper, MD;  Location: [facility];  Service: Obstetrics;  Laterality: N/A;   Family History  Problem Relation Age of Onset  . Stroke Maternal Uncle   . Diabetes Maternal Uncle   . Diabetes Paternal Grandmother    History  Substance Use Topics  . Smoking status: Current Every Day Smoker -- 0.25 packs/day for 4 years    Types: Cigarettes    Last Attempt to Quit: 11/13/2012  . Smokeless tobacco: Never Used  . Alcohol Use: No     Comment: social   OB History    Gravida Para Term Preterm AB TAB SAB Ectopic Multiple Living   5 3 3  0 2 1 1   3      Review of Systems  All other systems  reviewed and are negative.     Allergies  Penicillins  Home Medications   Prior to Admission medications   Medication Sig Start Date End Date Taking? Authorizing Provider  erythromycin ophthalmic ointment Place 1 application into the left eye 4 (four) times daily. Place 1/2 inch ribbon of ointment in the affected eye 4 times a day 07/28/14   Junious SilkHannah Merrell, PA-C  norelgestromin-ethinyl estradiol Burr Medico(XULANE) 150-35 MCG/24HR transdermal patch Place 1 patch onto the skin once a week. 10/12/14   Antionette CharLisa Jackson-Moore, MD   There were no vitals taken for this visit. Physical Exam  Constitutional: She is oriented to person, place, and time. She appears well-developed and well-nourished.  HENT:  Right Ear: External ear normal.  Left Ear: External ear normal.  Mouth/Throat: Posterior oropharyngeal edema and posterior oropharyngeal erythema present.  Eyes: Conjunctivae are normal. Pupils are equal, round, and reactive to light.  Cardiovascular: Normal rate and regular rhythm.   Pulmonary/Chest: Effort normal and breath sounds normal.  Abdominal: Soft. Bowel sounds are normal.  Musculoskeletal: Normal range of motion.  Neurological: She is alert and oriented to person, place, and time.  Skin: Skin is warm and dry.  Psychiatric: She has a normal mood and affect.  Nursing note and vitals reviewed.   ED Course  Procedures (including critical care  time) Labs Review Labs Reviewed  RAPID STREP SCREEN - Abnormal; Notable for the following:    Streptococcus, Group A Screen (Direct) POSITIVE (*)    All other components within normal limits    Imaging Review No results found.   EKG Interpretation None      MDM   Final diagnoses:  Strep pharyngitis   Pt has a pcn allergy. So will treat with zithromax. Pt fever down with ibuprofen. No meningeal symptoms    Teressa Lower, NP 01/20/15 1033  Toy Cookey, MD 01/21/15 [phone]

## 2015-01-20 NOTE — ED Notes (Signed)
Flu like symptoms started Wednesday night-- fever, chills, coughing. Taking OTC meds, fever still 103. Son was sick last week.

## 2015-01-23 ENCOUNTER — Encounter (HOSPITAL_COMMUNITY): Payer: Self-pay | Admitting: Emergency Medicine

## 2015-01-23 ENCOUNTER — Emergency Department (HOSPITAL_COMMUNITY)
Admission: EM | Admit: 2015-01-23 | Discharge: 2015-01-23 | Disposition: A | Discharge status: Home / Self Care | Payer: Medicaid Other | Attending: Emergency Medicine | Admitting: Emergency Medicine

## 2015-01-23 DIAGNOSIS — J018 Other acute sinusitis: Secondary | ICD-10-CM

## 2015-01-23 DIAGNOSIS — Z8679 Personal history of other diseases of the circulatory system: Secondary | ICD-10-CM | POA: Insufficient documentation

## 2015-01-23 DIAGNOSIS — Z792 Long term (current) use of antibiotics: Secondary | ICD-10-CM | POA: Diagnosis not present

## 2015-01-23 DIAGNOSIS — Z880 Allergy status to penicillin: Secondary | ICD-10-CM | POA: Insufficient documentation

## 2015-01-23 DIAGNOSIS — Z720 Tobacco use: Secondary | ICD-10-CM | POA: Insufficient documentation

## 2015-01-23 DIAGNOSIS — R509 Fever, unspecified: Secondary | ICD-10-CM | POA: Diagnosis present

## 2015-01-23 MED ORDER — DEXAMETHASONE SODIUM PHOSPHATE 10 MG/ML IJ SOLN
10.0000 mg | Freq: Once | INTRAMUSCULAR | Status: AC
  Administered 2015-01-23: 10 mg via INTRAMUSCULAR
  Filled 2015-01-23: qty 1

## 2015-01-23 NOTE — Discharge Instructions (Signed)
Continue taking Azithromycin as prescribed.  Use Mucinex over the counter or Mucinex multi-symptom cold and flu.  You may alternate medications which include tylenol with ibuprofen every 4 hours.  Return to the ER with any worsening of symptoms, throat swelling, difficulty swallowing or breathing.    Sinusitis Sinusitis is redness, soreness, and inflammation of the paranasal sinuses. Paranasal sinuses are air pockets within the bones of your face (beneath the eyes, the middle of the forehead, or above the eyes). In healthy paranasal sinuses, mucus is able to drain out, and air is able to circulate through them by way of your nose. However, when your paranasal sinuses are inflamed, mucus and air can become trapped. This can allow bacteria and other germs to grow and cause infection. Sinusitis can develop quickly and last only a short time (acute) or continue over a long period (chronic). Sinusitis that lasts for more than 12 weeks is considered chronic.  CAUSES  Causes of sinusitis include:  Allergies.  Structural abnormalities, such as displacement of the cartilage that separates your nostrils (deviated septum), which can decrease the air flow through your nose and sinuses and affect sinus drainage.  Functional abnormalities, such as when the small hairs (cilia) that line your sinuses and help remove mucus do not work properly or are not present. SIGNS AND SYMPTOMS  Symptoms of acute and chronic sinusitis are the same. The primary symptoms are pain and pressure around the affected sinuses. Other symptoms include:  Upper toothache.  Earache.  Headache.  Bad breath.  Decreased sense of smell and taste.  A cough, which worsens when you are lying flat.  Fatigue.  Fever.  Thick drainage from your nose, which often is green and may contain pus (purulent).  Swelling and warmth over the affected sinuses. DIAGNOSIS  Your health care provider will perform a physical exam. During the exam,  your health care provider may:  Look in your nose for signs of abnormal growths in your nostrils (nasal polyps).  Tap over the affected sinus to check for signs of infection.  View the inside of your sinuses (endoscopy) using an imaging device that has a light attached (endoscope). If your health care provider suspects that you have chronic sinusitis, one or more of the following tests may be recommended:  Allergy tests.  Nasal culture. A sample of mucus is taken from your nose, sent to a lab, and screened for bacteria.  Nasal cytology. A sample of mucus is taken from your nose and examined by your health care provider to determine if your sinusitis is related to an allergy. TREATMENT  Most cases of acute sinusitis are related to a viral infection and will resolve on their own within 10 days. Sometimes medicines are prescribed to help relieve symptoms (pain medicine, decongestants, nasal steroid sprays, or saline sprays).  However, for sinusitis related to a bacterial infection, your health care provider will prescribe antibiotic medicines. These are medicines that will help kill the bacteria causing the infection.  Rarely, sinusitis is caused by a fungal infection. In theses cases, your health care provider will prescribe antifungal medicine. For some cases of chronic sinusitis, surgery is needed. Generally, these are cases in which sinusitis recurs more than 3 times per year, despite other treatments. HOME CARE INSTRUCTIONS   Drink plenty of water. Water helps thin the mucus so your sinuses can drain more easily.  Use a humidifier.  Inhale steam 3 to 4 times a day (for example, sit in the bathroom with the  shower running).  Apply a warm, moist washcloth to your face 3 to 4 times a day, or as directed by your health care provider.  Use saline nasal sprays to help moisten and clean your sinuses.  Take medicines only as directed by your health care provider.  If you were prescribed  either an antibiotic or antifungal medicine, finish it all even if you start to feel better. SEEK IMMEDIATE MEDICAL CARE IF:  You have increasing pain or severe headaches.  You have nausea, vomiting, or drowsiness.  You have swelling around your face.  You have vision problems.  You have a stiff neck.  You have difficulty breathing. MAKE SURE YOU:   Understand these instructions.  Will watch your condition.  Will get help right away if you are not doing well or get worse. Document Released: 10/28/2005 Document Revised: 03/14/2014 Document Reviewed: 11/12/2011 [facility] Patient Information 2015 [place], [place]. This information is not intended to replace advice given to you by your health care provider. Make sure you discuss any questions you have with your health care provider.   Emergency Department Resource Guide 1) Find a Doctor and Pay Out of Pocket Although you won't have to find out who is covered by your insurance plan, it is a good idea to ask around and get recommendations. You will then need to call the office and see if the doctor you have chosen will accept you as a new patient and what types of options they offer for patients who are self-pay. Some doctors offer discounts or will set up payment plans for their patients who do not have insurance, but you will need to ask so you aren't surprised when you get to your appointment.  2) Contact Your Local Health Department Not all health departments have doctors that can see patients for sick visits, but many do, so it is worth a call to see if yours does. If you don't know where your local health department is, you can check in your phone book. The CDC also has a tool to help you locate your state's health department, and many state websites also have listings of all of their local health departments.  3) Find a Walk-in Clinic If your illness is not likely to be very severe or complicated, you may want to try a walk in clinic.  These are popping up all over the country in pharmacies, drugstores, and shopping centers. They're usually staffed by nurse practitioners or physician assistants that have been trained to treat common illnesses and complaints. They're usually fairly quick and inexpensive. However, if you have serious medical issues or chronic medical problems, these are probably not your best option.  No Primary Care Doctor: - Call Health Connect at  [phone] - they can help you locate a primary care doctor that  accepts your insurance, provides certain services, etc. - Physician Referral Service- [phone]  Chronic Pain Problems: Organization         Address  Phone   Notes  [facility]  [phone] Patients need to be referred by their primary care doctor.   Medication Assistance: Organization         Address  Phone   Notes  [facility] [address] [place], [place] [phone] --Must be a resident of [place] [facility] [place] -- Must have NO insurance coverage whatsoever (no Medicaid/ Medicare, etc.) -- The pt. MUST have a primary care doctor that directs their care regularly  and follows them in the community   Med[facility]ist  [phone]   [employer]  [phone]    Agencies that provide inexpensive medical care: Organization         Address  Phone   Notes  [facility]  [phone]   [facility]    [phone]   [facility] [address] [place], [place] [address] [phone]   [facility] [address], [place] [phone]   Planned Parenthood    [phone]   [facility]    [phone]   [facility]  [address], [place] Phone:  [phone], Fax:  [phone] Hours of Operation:  9 am - 6 pm, M-F.  Also accepts Medicaid/Medicare and self-pay.  [facility] for  Children  [address], [place] Phone: [phone], Fax: [phone]. Hours of Operation:  8:30 am - 5:30 pm, M-F.  Also accepts Medicaid and self-pay.  [facility] [place] [address] [place] Phone: [phone]   [facility] [address] Natasha Bence [place], [place] [phone], Ext. [phone] Mondays & Thursdays: 7-9 AM.  First 15 patients are seen on a first come, first serve basis.    Medicaid-accepting [facility] Providers:  Organization         Address  Phone   Notes  [facility] [address], [place] [phone] Also accepts self-pay patients.  [facility] [address] Laurell Josephs [place], [place]  [phone]   [facility] [address], [place] [phone]   [facility] [address] [place] [phone]   Renaye Rakers [address], [place]   [phone] Only accepts [place] Access [place] patients after they have their name applied to their card.   Self-Pay (no insurance) in [facility]:  Organization         Address  Phone   Notes  Sickle Cell Patients, [facility] [address], [place] [phone]   [facility] [address] [place], [place] [phone]   [facility]  [address], [place] [phone]   [facility]/Dr. Osei-Bonsu  [address], [place] or [address], [place] [phone] Phone number for both [place] and [place] locations is the same.  [facility] [address] [place] [phone]   [facility] [address] [place] or [address] [phone]   [facility] [address], [place] [phone], phone; [phone], fax Sees patients 1st  and 3rd Saturday of every month.  Must not qualify for public or private insurance (i.e. Medicaid, Medicare, [place] Health Choice, Veterans' Benefits)  Household income should be no more than 200% of the poverty level The clinic cannot treat you if you are pregnant or think you are pregnant  Sexually transmitted diseases are not treated at the clinic.    Dental Care: Organization         Address  Phone  Notes  [facility] Department of [facility] [address] [place], [place] [phone] Accepts children up to age 63 who  are enrolled in Medicaid or [place] Health Choice; pregnant women with a Medicaid card; and children who have applied for Medicaid or [place] Health Choice, but were declined, whose parents can pay a reduced fee at time of service.  [facility] of [facility]  [address], [place] [phone] Accepts children up to age 10 who are enrolled in [place] or [place] Health Choice; pregnant women with a Medicaid card; and children who have applied for Medicaid or [place] Health Choice, but were declined, whose parents can pay a reduced fee at time of service.  [facility]  [address] [place], [place] [phone] Patients are seen by appointment only. Walk-ins are not accepted. [facility] will see patients 40 years of age and older. Monday - Tuesday (8am-5pm) Most Wednesdays (8:30-5pm) $30 per visit, cash only  [facility] Adult Dental Access PROGRAM  [address], [facility] [phone] Patients are seen by appointment only. Walk-ins are not accepted. [facility] will see patients 24 years of age and older. One Wednesday Evening (Monthly: Volunteer Based).  $30 per visit, cash only  [employer]  [phone] for adults; Children under age 67, call Graduate Pediatric Dentistry at [phone]. Children aged 6-14, please call [phone] to request a pediatric  application.  Dental services are provided in all areas of dental care including fillings, crowns and bridges, complete and partial dentures, implants, gum treatment, root canals, and extractions. Preventive care is also provided. Treatment is provided to both adults and children. Patients are selected via a lottery and there is often a waiting list.   [facility] [address], [place]  [phone] [email or web address]   [facility] [address] [place], [place] [phone], Ext. [phone] Second and Fourth Thursday of each month, opens at 6:30 AM; Clinic ends at 9 AM.  Patients are seen on a first-come first-served basis, and a limited number are seen during each clinic.   [facility]  [address] Ether Griffins [place], [place] [phone]   Eligibility Requirements You must have lived in [place], [place], or [place] counties for at least the last three months.   You cannot be eligible for state or federal sponsored [place], including [employer], [place], or [employer].   You generally cannot be eligible for healthcare insurance through your employer.    How to apply: Eligibility screenings are held every Tuesday and Wednesday afternoon from 1:00 pm until 4:00 pm. You do not need an appointment for the interview!  [facility] [address] [place], [place] [phone]   [facility] Health Department  [phone]   [facility] Health Department  [phone]   [facility] Health Department  [phone]    Behavioral Health Resources in the Community: Intensive Outpatient Programs Organization         Address  Phone  Notes  [facility] [address] [place], [place] [phone]   [facility] Outpatient [address], [place], [place] [phone]   [facility] [address] [place], [place]  [phone]   [facility] [address] Mental Health  [address]  [place], [place] [phone] or [phone]   Substance Abuse Resources Organization         Address  Phone  Notes  [facility] Drug [facility]  [phone]   [facility]  [phone]  [facility]  [phone]   Floydene Flock  [phone]   Residential & Outpatient Substance Abuse Program  [phone]   Psychological Services Organization         Address  Phone  Notes  [facility] Behavioral Health  [phone]   [facility] Services  [phone]   [facility] [address], [place] [phone] or [phone]    Mobile Crisis Teams Organization         Address  Phone  Notes  [facility], Mobile Crisis Care Unit  [phone]   [facility]  [address] [place], [place] [phone]   Doristine Locks [address] [place] [phone]    Self-Help/Support Groups Organization         Address  Phone             Notes  [facility] [place] - variety of support groups  336- I7437963 Call for more information  Narcotics Anonymous (NA), [facility] [address], [employer] [place]  2 meetings at this location   Statistician         Address  Phone  Notes  [facility] [address],    [place]  [phone]   [facility]  [address] [place] [address], [place], [place] [phone]   [facility] [address] [place], [place] [phone] Admissions: 8am-3pm M-F  [facility] [address]    [place], [place] [phone]   [facility] [address] [place], [place], [place] [phone]   [facility] [address]  [place], [place] [phone]   [facility] - Intensive Outpatient [address] Laurell Josephs [address], [place], [place] [phone]   [facility] ([facility] [address] [place]    [place], [place] [phone] or [phone]   [facility] ([facility]) [address] [place], [place] [phone] Accepts Medicaid  [facility] [address],  [place] [phone] Substance Abuse/Addiction Treatment   [facility] Organization         Address  Phone  Notes  [facility]  [phone]   Angie Fava, PhD [address] Ervin Knack [place], [place]   [phone] or [phone]   [facility] Behavioral   [address] [place], [place] [phone]   [facility] [address] [place], [place] [phone] Insurance/Medicaid/sponsorship through [facility] and Families [address]                                    [employer], [place] [phone] Therapy/tele-psych/case  [facility] [address] [place], [place] [phone]    Dr. Lolly Mustache  [phone]   [facility]  [employer] [facility] 1) [address], [place] 2) [address] [place] 3)  [address], [place] [phone]  [phone]   [facility] Child Abuse Hotline 239 014 1620 or [phone] (After Hours)

## 2015-01-23 NOTE — ED Notes (Signed)
Pt here with flu like sx, sore throat, nasal congestion and fever x 3 days; pt sts taking antibiotics for strep and URI; pt sts some relief after taking motrin

## 2015-01-23 NOTE — ED Provider Notes (Signed)
CSN: [id number]     Arrival date & time 01/23/15  1543 History   First MD Initiated Contact with Patient 01/23/15 1652     Chief Complaint  Patient presents with  . Sore Throat  . Fever  . Nasal Congestion     (Consider location/radiation/quality/duration/timing/severity/associated sxs/prior Treatment) HPI Frances [name] is a 24 year old female who presents the ER complaining of sore throat, sinus pressure. Patient states she was seen and evaluated on 01/20/15, diagnosed with strep pharyngitis and treated with azithromycin due to penicillin allergy. Patient states she has been compliant with these therapies, and has also been using TheraFlu symptomatically. Patient reports since then she has had worsening of her nasal congestion and sinus pressure in her maxillary sinuses. Patient states her sore throat has been improving since being on the azithromycin, and ibuprofen has been helping with her pain. Patient denies dysphasia, shortness of breath, dizziness, blurred vision, weakness, chest pain, nausea, vomiting, diarrhea.  Past Medical History  Diagnosis Date  . Complication of anesthesia     with csections, felt like she "couldnt swallow during the delivery of baby"  . No pertinent past medical history     preeclampsia with 2010 pregnancy  . Pregnancy induced hypertension   . Ventricular hypertrophy   . History of pre-eclampsia in prior pregnancy, currently pregnant   . Headache(784.0)   . Obesity (BMI 35.0-39.9 without comorbidity)   . Shortness of breath     sob during the night at times   Past Surgical History  Procedure Laterality Date  . Cesarean section    . Cesarean section N/A 07/14/2013    Procedure: REPEAT CESAREAN SECTION;  Surgeon: Brock Badharles A Harper, MD;  Location: [facility];  Service: Obstetrics;  Laterality: N/A;   Family History  Problem Relation Age of Onset  . Stroke Maternal Uncle   . Diabetes Maternal Uncle   . Diabetes Paternal Grandmother    History  Substance  Use Topics  . Smoking status: Current Every Day Smoker -- 0.25 packs/day for 4 years    Types: Cigarettes    Last Attempt to Quit: 11/13/2012  . Smokeless tobacco: Never Used  . Alcohol Use: No     Comment: social   OB History    Gravida Para Term Preterm AB TAB SAB Ectopic Multiple Living   5 3 3  0 2 1 1   3      Review of Systems  Constitutional: Negative for fever.  HENT: Positive for congestion and sinus pressure. Negative for trouble swallowing and voice change.   Eyes: Negative for visual disturbance.  Respiratory: Negative for shortness of breath.   Cardiovascular: Negative for chest pain.  Gastrointestinal: Negative for nausea, vomiting and abdominal pain.  Genitourinary: Negative for dysuria.  Skin: Negative for rash.  Neurological: Negative for dizziness, syncope, weakness and numbness.  Psychiatric/Behavioral: Negative.       Allergies  Lisinopril and Penicillins  Home Medications   Prior to Admission medications   Medication Sig Start Date End Date Taking? Authorizing Provider  azithromycin (ZITHROMAX) 250 MG tablet Take 1 tablet (250 mg total) by mouth daily. Take first 2 tablets together, then 1 every day until finished. 01/20/15   Teressa LowerVrinda Pickering, NP  DM-Doxylamine-Acetaminophen (NYQUIL COLD & FLU PO) Take 30 mLs by mouth 2 (two) times daily as needed (cold/flu symptoms).    Historical Provider, MD  erythromycin ophthalmic ointment Place 1 application into the left eye 4 (four) times daily. Place 1/2 inch ribbon of ointment in the affected eye  4 times a day Patient not taking: Reported on 01/20/2015 07/28/14   Junious Silk, PA-C  norelgestromin-ethinyl estradiol [facility]) 150-35 MCG/24HR transdermal patch Place 1 patch onto the skin once a week. 10/12/14   Antionette Char, MD   BP 142/85 mmHg  Pulse 120  Temp(Src) 99.9 F (37.7 C) (Oral)  Resp 18  Ht  (1.753 m)  Wt 327 lb 9.6 oz (148.598 kg)  BMI 48.36 kg/m2  SpO2 99%  LMP 01/20/2015 (Exact  Date) Physical Exam  Constitutional: She is oriented to person, place, and time. She appears well-developed and well-nourished. No distress.  HENT:  Head: Normocephalic and atraumatic.  Right Ear: Tympanic membrane normal.  Left Ear: Tympanic membrane normal.  Nose: Right sinus exhibits maxillary sinus tenderness and frontal sinus tenderness. Left sinus exhibits maxillary sinus tenderness and frontal sinus tenderness.  Mouth/Throat: Uvula is midline. No trismus in the jaw. No dental abscesses or uvula swelling. Oropharyngeal exudate and posterior oropharyngeal erythema present. No tonsillar abscesses.  Mild, bilateral erythema and edema noted to nasal turbinates. Maxillary and frontal sinus tenderness bilaterally. Moderate amount of tonsillar swelling with exudates noted. No uvular deviation. No retropharyngeal swelling, or peritonsillar fold swelling.  Eyes: Right eye exhibits no discharge. Left eye exhibits no discharge. No scleral icterus.  Neck: Normal range of motion.  Pulmonary/Chest: Effort normal. No respiratory distress.  Musculoskeletal: Normal range of motion.  Neurological: She is alert and oriented to person, place, and time.  Skin: Skin is warm and dry. She is not diaphoretic.  Psychiatric: She has a normal mood and affect.  Nursing note and vitals reviewed.   ED Course  Procedures (including critical care time) Labs Review Labs Reviewed - No data to display  Imaging Review No results found.   EKG Interpretation None      MDM   Final diagnoses:  Other acute sinusitis    No peritonsillar fold swelling or uvular deviation.  No concern for PTA or retropharyngeal abscess.  Mild to moderate symptoms of clear/yellow nasal discharge/congestion with improving sore throat for less than 10 days.  Patient is afebrile. Patient's signs and symptoms consistent with a sinusitis. No concern for acute bacterial rhinosinusitis; likely viral in nature.  Patient discharged with  symptomatic treatment.  Patient instructions given for warm saline nasal washes, to continue azithromycin for her previous strep pharyngitis treatment, along with Mucinex over-the-counter multisymptom cold and flu for symptomatic relief.  Recommendations for follow-up with primary care physician, and provided resource guide for same. I discussed return precautions with patient, patient verbalizes understanding and agreement of this plan. I encouraged patient to call or return to ER if any worsening of symptoms or should she have any questions or concerns.  BP 142/85 mmHg  Pulse 120  Temp(Src) 99.9 F (37.7 C) (Oral)  Resp 18  Ht  (1.753 m)  Wt 327 lb 9.6 oz (148.598 kg)  BMI 48.36 kg/m2  SpO2 99%  LMP 01/20/2015 (Exact Date)  Signed,  Ladona Mow, PA-C 6:49 PM  Patient seen and discussed with Dr. Rolan Bucco, MD    Ladona Mow, PA-C 01/23/15 1849  Rolan Bucco, MD 01/23/15 [phone]

## 2015-12-08 ENCOUNTER — Telehealth: Payer: Self-pay | Admitting: Obstetrics

## 2015-12-08 NOTE — Telephone Encounter (Signed)
12/08/2015 - Called patient and left her a vm to please call and scg=hedule her AEX in order for her RX to be refilled. brm

## 2015-12-25 NOTE — Telephone Encounter (Signed)
CLOSE ENCOUNTER °

## 2016-01-23 ENCOUNTER — Ambulatory Visit (INDEPENDENT_AMBULATORY_CARE_PROVIDER_SITE_OTHER): Payer: 59 | Admitting: Certified Nurse Midwife

## 2016-01-23 ENCOUNTER — Encounter: Payer: Self-pay | Admitting: Certified Nurse Midwife

## 2016-01-23 VITALS — BP 130/82 | HR 103 | Wt 365.0 lb

## 2016-01-23 DIAGNOSIS — Z3202 Encounter for pregnancy test, result negative: Secondary | ICD-10-CM | POA: Diagnosis not present

## 2016-01-23 DIAGNOSIS — Z113 Encounter for screening for infections with a predominantly sexual mode of transmission: Secondary | ICD-10-CM

## 2016-01-23 DIAGNOSIS — Z01419 Encounter for gynecological examination (general) (routine) without abnormal findings: Secondary | ICD-10-CM

## 2016-01-23 DIAGNOSIS — Z30018 Encounter for initial prescription of other contraceptives: Secondary | ICD-10-CM

## 2016-01-23 DIAGNOSIS — E669 Obesity, unspecified: Secondary | ICD-10-CM | POA: Insufficient documentation

## 2016-01-23 LAB — POCT URINE PREGNANCY: Preg Test, Ur: NEGATIVE

## 2016-01-23 MED ORDER — NORELGESTROMIN-ETH ESTRADIOL 150-35 MCG/24HR TD PTWK: 1.0000 | MEDICATED_PATCH | TRANSDERMAL | Status: DC

## 2016-01-23 NOTE — Progress Notes (Addendum)
Patient ID: Frances [name], female   DOB: [birth year], 25 y.o.   MRN: [id number]    Subjective:      Frances LoftsSkyler I Tantillo is a 25 y.o. female here for a routine exam.  Current complaints: desires to continue on ortho evra patches, for about 6 years. Periods are monthly, lasting 7 days, denies any clots, cramping, or heavy bleeding.    Denies any family hx of blood clots. Currently sexually active, using condoms, last reported intercourse over 2 weeks ago.  Employed.   Has been on medications for HTN in the past, not currently taking any medications.  Is watching her salt intake. Takes her own b/p at home.  Exercising currently at home.    Personal health questionnaire:  Is patient [name], have a family history of breast and/or ovarian cancer: no Is there a family history of uterine cancer diagnosed at age < 5550, gastrointestinal cancer, urinary tract cancer, family member who is a Personnel officerLynch syndrome-associated carrier: no Is the patient overweight and hypertensive, family history of diabetes, personal history of gestational diabetes, preeclampsia or PCOS: yes Is patient over 4555, have PCOS,  family history of premature CHD under age 27, diabetes, smoke, have hypertension or peripheral artery disease:  yes At any time, has a partner hit, kicked or otherwise hurt or frightened you?: no Over the past 2 weeks, have you felt down, depressed or hopeless?: no Over the past 2 weeks, have you felt little interest or pleasure in doing things?:no   Gynecologic History Patient's last menstrual period was 12/31/2015. Contraception: condoms Last Pap: 2014. Results were: ASCUS Last mammogram: N/A.   Obstetric History OB History  Gravida Para Term Preterm AB SAB TAB Ectopic Multiple Living  5 3 3  0 2 1 1   3     # Outcome Date GA Lbr Len/2nd Weight Sex Delivery Anes PTL Lv  5 Term 07/14/13 3572w5d  8 lb 9.9 oz (3.91 kg) M CS-LTranv Spinal  Y  4 Term 2011 749w0d    CS-Unspec     3 Term 2010 659w0d     CS-Unspec     2 TAB 2005          1 SAB               Past Medical History  Diagnosis Date  . Complication of anesthesia     with csections, felt like she "couldnt swallow during the delivery of baby"  . No pertinent past medical history     preeclampsia with 2010 pregnancy  . Pregnancy induced hypertension   . Ventricular hypertrophy   . History of pre-eclampsia in prior pregnancy, currently pregnant   . Headache(784.0)   . Obesity (BMI 35.0-39.9 without comorbidity) (HCC)   . Shortness of breath     sob during the night at times    Past Surgical History  Procedure Laterality Date  . Cesarean section    . Cesarean section N/A 07/14/2013    Procedure: REPEAT CESAREAN SECTION;  Surgeon: Brock Badharles A Harper, MD;  Location: [facility];  Service: Obstetrics;  Laterality: N/A;     Current outpatient prescriptions:  .  DM-Doxylamine-Acetaminophen (NYQUIL COLD & FLU PO), Take 30 mLs by mouth 2 (two) times daily as needed (cold/flu symptoms). Reported on 01/23/2016, Disp: , Rfl:  .  erythromycin ophthalmic ointment, Place 1 application into the left eye 4 (four) times daily. Place 1/2 inch ribbon of ointment in the affected eye 4 times a day (Patient not taking: Reported on  01/20/2015), Disp: 1 g, Rfl: 1 .  norelgestromin-ethinyl estradiol (ORTHO EVRA) 150-35 MCG/24HR transdermal patch, Place 1 patch onto the skin once a week., Disp: 3 patch, Rfl: 12 Allergies  Allergen Reactions  . Lisinopril Anaphylaxis  . Penicillins Hives    Social History  Substance Use Topics  . Smoking status: Current Every Day Smoker -- 0.25 packs/day for 4 years    Types: Cigarettes    Last Attempt to Quit: 11/13/2012  . Smokeless tobacco: Never Used  . Alcohol Use: 0.0 oz/week    0 Standard drinks or equivalent per week     Comment: social    Family History  Problem Relation Age of Onset  . Stroke Maternal Uncle   . Diabetes Maternal Uncle   . Diabetes Paternal Grandmother       Review of  Systems  Constitutional: negative for fatigue and weight loss Respiratory: negative for cough and wheezing Cardiovascular: negative for chest pain, fatigue and palpitations Gastrointestinal: negative for abdominal pain and change in bowel habits Musculoskeletal:negative for myalgias Neurological: negative for gait problems and tremors Behavioral/Psych: negative for abusive relationship, depression Endocrine: negative for temperature intolerance   Genitourinary:negative for abnormal menstrual periods, genital lesions, hot flashes, sexual problems and vaginal discharge Integument/breast: negative for breast lump, breast tenderness, nipple discharge and skin lesion(s)    Objective:       BP 130/82 mmHg  Pulse 103  Wt 365 lb (165.563 kg)  LMP 12/31/2015 General:   alert  Skin:   no rash or abnormalities  Lungs:   clear to auscultation bilaterally  Heart:   regular rate and rhythm, S1, S2 normal, no murmur, click, rub or gallop  Breasts:   normal without suspicious masses, skin or nipple changes or axillary nodes  Abdomen:  normal findings: no organomegaly, soft, non-tender and no hernia  Pelvis:  External genitalia: normal general appearance Urinary system: urethral meatus normal and bladder without fullness, nontender Vaginal: normal without tenderness, induration or masses Cervix: normal appearance, seems steniotic Adnexa: normal bimanual exam Uterus: anteverted and non-tender, normal size   Lab Review Urine pregnancy test Labs reviewed yes Radiologic studies reviewed no  50% of 30 min visit spent on counseling and coordination of care.   Assessment:    Healthy female exam.   STD screening exam  obese  Contraception management  Plan:    Education reviewed: calcium supplements, depression evaluation, low fat, low cholesterol diet, safe sex/STD prevention, self breast exams, skin cancer screening and weight bearing exercise. Contraception: condoms. Follow up in: 1  year.   Meds ordered this encounter  Medications  . norelgestromin-ethinyl estradiol (ORTHO EVRA) 150-35 MCG/24HR transdermal patch    Sig: Place 1 patch onto the skin once a week.    Dispense:  3 patch    Refill:  12   Orders Placed This Encounter  Procedures  . NuSwab Vaginitis Plus (VG+)  . HIV antibody (with reflex)  . Hepatitis B surface antigen  . RPR  . Hepatitis C antibody  . TSH  . CBC with Differential/Platelet  . Comprehensive metabolic panel  . Hemoglobin A1c  . Ambulatory referral to Internal Medicine    Referral Priority:  Routine    Referral Type:  Consultation    Referral Reason:  Specialty Services Required    Requested Specialty:  Internal Medicine    Number of Visits Requested:  1  . Referral to Nutrition and Diabetes Services    Referral Priority:  Routine    Referral Type:  Consultation    Referral Reason:  Specialty Services Required    Number of Visits Requested:  1  . POCT urine pregnancy   Possible management options include: gastric bypass Follow up as needed.

## 2016-01-24 LAB — COMPREHENSIVE METABOLIC PANEL
A/G RATIO: 1.4 (ref 1.2–2.2)
ALT: 16 IU/L (ref 0–32)
AST: 20 IU/L (ref 0–40)
Albumin: 4.3 g/dL (ref 3.5–5.5)
Alkaline Phosphatase: 72 IU/L (ref 39–117)
BUN/Creatinine Ratio: 13 (ref 8–20)
BUN: 10 mg/dL (ref 6–20)
Bilirubin Total: 0.2 mg/dL (ref 0.0–1.2)
CALCIUM: 9.8 mg/dL (ref 8.7–10.2)
CHLORIDE: 100 mmol/L (ref 96–106)
CO2: 24 mmol/L (ref 18–29)
Creatinine, Ser: 0.76 mg/dL (ref 0.57–1.00)
GFR calc Af Amer: 125 mL/min/{1.73_m2} (ref >59)
GFR calc non Af Amer: 109 mL/min/{1.73_m2} (ref >59)
Globulin, Total: 3 g/dL (ref 1.5–4.5)
Glucose: 78 mg/dL (ref 65–99)
POTASSIUM: 4.2 mmol/L (ref 3.5–5.2)
Sodium: 140 mmol/L (ref 134–144)
Total Protein: 7.3 g/dL (ref 6.0–8.5)

## 2016-01-24 LAB — CBC WITH DIFFERENTIAL/PLATELET
Basophils Absolute: 0 10*3/uL (ref 0.0–0.2)
Basos: 0 %
EOS (ABSOLUTE): 0.2 10*3/uL (ref 0.0–0.4)
Eos: 2 %
Hematocrit: 38.3 % (ref 34.0–46.6)
Hemoglobin: 12.6 g/dL (ref 11.1–15.9)
IMMATURE GRANULOCYTES: 0 %
Immature Grans (Abs): 0 10*3/uL (ref 0.0–0.1)
Lymphocytes Absolute: 3.5 10*3/uL — ABNORMAL HIGH (ref 0.7–3.1)
Lymphs: 30 %
MCH: 27.9 pg (ref 26.6–33.0)
MCHC: 32.9 g/dL (ref 31.5–35.7)
MCV: 85 fL (ref 79–97)
Monocytes Absolute: 1 10*3/uL — ABNORMAL HIGH (ref 0.1–0.9)
Monocytes: 8 %
NEUTROS PCT: 60 %
Neutrophils Absolute: 6.8 10*3/uL (ref 1.4–7.0)
PLATELETS: 356 10*3/uL (ref 150–379)
RBC: 4.51 x10E6/uL (ref 3.77–5.28)
RDW: 14.5 % (ref 12.3–15.4)
WBC: 11.6 10*3/uL — AB (ref 3.4–10.8)

## 2016-01-24 LAB — RPR: RPR Ser Ql: NONREACTIVE

## 2016-01-24 LAB — HEPATITIS B SURFACE ANTIGEN: Hepatitis B Surface Ag: NEGATIVE

## 2016-01-24 LAB — HEPATITIS C ANTIBODY

## 2016-01-24 LAB — TSH: TSH: 1.48 u[IU]/mL (ref 0.450–4.500)

## 2016-01-24 LAB — HIV ANTIBODY (ROUTINE TESTING W REFLEX): HIV Screen 4th Generation wRfx: NONREACTIVE

## 2016-01-25 LAB — PAP IG W/ RFLX HPV ASCU: PAP Smear Comment: 0

## 2016-01-25 LAB — NUSWAB VAGINITIS PLUS (VG+)
Atopobium vaginae: HIGH Score — AB
BVAB 2: HIGH Score — AB
CHLAMYDIA TRACHOMATIS, NAA: NEGATIVE
Candida albicans, NAA: POSITIVE — AB
Candida glabrata, NAA: NEGATIVE
Megasphaera 1: HIGH Score — AB
NEISSERIA GONORRHOEAE, NAA: NEGATIVE
TRICH VAG BY NAA: NEGATIVE

## 2016-01-26 ENCOUNTER — Other Ambulatory Visit: Payer: Self-pay | Admitting: Certified Nurse Midwife

## 2016-01-26 DIAGNOSIS — B9689 Other specified bacterial agents as the cause of diseases classified elsewhere: Secondary | ICD-10-CM

## 2016-01-26 DIAGNOSIS — N760 Acute vaginitis: Secondary | ICD-10-CM

## 2016-01-26 MED ORDER — FLUCONAZOLE 100 MG PO TABS: 100.0000 mg | ORAL_TABLET | Freq: Once | ORAL | Status: DC

## 2016-01-26 MED ORDER — TERCONAZOLE 0.4 % VA CREA: 1.0000 | TOPICAL_CREAM | Freq: Every day | VAGINAL | Status: DC

## 2016-01-26 MED ORDER — METRONIDAZOLE 500 MG PO TABS: 500.0000 mg | ORAL_TABLET | Freq: Two times a day (BID) | ORAL | Status: DC

## 2016-01-30 ENCOUNTER — Other Ambulatory Visit: Payer: Self-pay | Admitting: Certified Nurse Midwife

## 2016-02-02 ENCOUNTER — Encounter: Payer: Self-pay | Admitting: *Deleted

## 2016-02-20 LAB — HEMOGLOBIN A1C
Est. average glucose Bld gHb Est-mCnc: 134 mg/dL
HEMOGLOBIN A1C: 6.3 % — AB (ref 4.8–5.6)

## 2016-02-20 LAB — SPECIMEN STATUS REPORT

## 2016-03-05 ENCOUNTER — Ambulatory Visit: Payer: Medicaid Other | Admitting: Dietician

## 2016-03-28 ENCOUNTER — Encounter (HOSPITAL_COMMUNITY): Payer: Self-pay | Admitting: *Deleted

## 2016-03-28 ENCOUNTER — Emergency Department (HOSPITAL_COMMUNITY)
Admission: EM | Admit: 2016-03-28 | Discharge: 2016-03-28 | Disposition: A | Discharge status: Home / Self Care | Payer: Medicaid Other | Attending: Emergency Medicine | Admitting: Emergency Medicine

## 2016-03-28 DIAGNOSIS — E669 Obesity, unspecified: Secondary | ICD-10-CM | POA: Diagnosis not present

## 2016-03-28 DIAGNOSIS — J029 Acute pharyngitis, unspecified: Secondary | ICD-10-CM | POA: Diagnosis present

## 2016-03-28 DIAGNOSIS — F17210 Nicotine dependence, cigarettes, uncomplicated: Secondary | ICD-10-CM | POA: Diagnosis not present

## 2016-03-28 DIAGNOSIS — J039 Acute tonsillitis, unspecified: Secondary | ICD-10-CM

## 2016-03-28 DIAGNOSIS — R5383 Other fatigue: Secondary | ICD-10-CM | POA: Diagnosis not present

## 2016-03-28 DIAGNOSIS — Z8679 Personal history of other diseases of the circulatory system: Secondary | ICD-10-CM | POA: Insufficient documentation

## 2016-03-28 DIAGNOSIS — Z880 Allergy status to penicillin: Secondary | ICD-10-CM | POA: Insufficient documentation

## 2016-03-28 LAB — RAPID STREP SCREEN (MED CTR MEBANE ONLY): STREPTOCOCCUS, GROUP A SCREEN (DIRECT): NEGATIVE

## 2016-03-28 MED ORDER — CLINDAMYCIN HCL 300 MG PO CAPS: 300.0000 mg | ORAL_CAPSULE | Freq: Three times a day (TID) | ORAL | Status: DC

## 2016-03-28 MED ORDER — PREDNISONE 10 MG PO TABS: 20.0000 mg | ORAL_TABLET | Freq: Two times a day (BID) | ORAL | Status: DC

## 2016-03-28 MED ORDER — CLINDAMYCIN HCL 150 MG PO CAPS
300.0000 mg | ORAL_CAPSULE | Freq: Once | ORAL | Status: AC
  Administered 2016-03-28: 300 mg via ORAL
  Filled 2016-03-28: qty 2

## 2016-03-28 MED ORDER — DEXAMETHASONE SODIUM PHOSPHATE 10 MG/ML IJ SOLN
10.0000 mg | Freq: Once | INTRAMUSCULAR | Status: AC
  Administered 2016-03-28: 10 mg via INTRAMUSCULAR
  Filled 2016-03-28: qty 1

## 2016-03-28 NOTE — ED Notes (Signed)
MD at bedside. 

## 2016-03-28 NOTE — ED Notes (Signed)
Given popcicle for her sore throat.

## 2016-03-28 NOTE — ED Notes (Signed)
Patient presents with c/o sore throat and fever that started last night

## 2016-03-28 NOTE — ED Provider Notes (Signed)
CSN: [id number]     Arrival date & time 03/28/16  1953 History  By signing my name below, I, Frances [name], attest that this documentation has been prepared under the direction and in the presence of [facility], NP-C. Electronically Signed: Phillis [name], ED Scribe. 03/28/2016. 10:11 PM.   Chief Complaint  Patient presents with  . Sore Throat  . Fever   Patient is a 25 y.o. female presenting with pharyngitis and fever. The history is provided by the patient. No language interpreter was used.  Sore Throat This is a new problem. The current episode started yesterday. The problem occurs constantly. The problem has been gradually worsening. She has tried nothing for the symptoms.  Fever Associated symptoms: chills, congestion, cough, rhinorrhea and sore throat   Associated symptoms: no nausea and no vomiting   HPI Comments: Frances [name] is a 25 y.o. female who presents to the Emergency Department complaining of sore throat onset one day ago. She states that it feels like her throat is closing. Pt reports associated fever tmax 101.3 F, rhinorrhea, congestion, cough, chills, fatigue, trouble swallowing, and voice change. Pt's son was evaluated in the ED tonight for sore throat and rash. She has not taken anything for her symptoms. She denies nausea or vomiting.  Pt is allergic to penicillins.  Past Medical History  Diagnosis Date  . Complication of anesthesia     with csections, felt like she "couldnt swallow during the delivery of baby"  . No pertinent past medical history     preeclampsia with 2010 pregnancy  . Pregnancy induced hypertension   . Ventricular hypertrophy   . History of pre-eclampsia in prior pregnancy, currently pregnant   . Headache(784.0)   . Obesity (BMI 35.0-39.9 without comorbidity) (HCC)   . Shortness of breath     sob during the night at times   Past Surgical History  Procedure Laterality Date  . Cesarean section    . Cesarean section N/A 07/14/2013     Procedure: REPEAT CESAREAN SECTION;  Surgeon: Brock Bad, MD;  Location: [facility];  Service: Obstetrics;  Laterality: N/A;   Family History  Problem Relation Age of Onset  . Stroke Maternal Uncle   . Diabetes Maternal Uncle   . Diabetes Paternal Grandmother    Social History  Substance Use Topics  . Smoking status: Current Every Day Smoker -- 0.25 packs/day for 4 years    Types: Cigarettes    Last Attempt to Quit: 11/13/2012  . Smokeless tobacco: Never Used  . Alcohol Use: 0.0 oz/week    0 Standard drinks or equivalent per week     Comment: social   OB History    Gravida Para Term Preterm AB TAB SAB Ectopic Multiple Living   0 Review of Systems  Constitutional: Positive for fever, chills and fatigue.  HENT: Positive for congestion, rhinorrhea and sore throat.   Respiratory: Positive for cough.   Gastrointestinal: Negative for nausea and vomiting.  All other systems reviewed and are negative.  Allergies  Lisinopril and Penicillins  Home Medications   Prior to Admission medications   Medication Sig Start Date End Date Taking? Authorizing Provider  norelgestromin-ethinyl estradiol (ORTHO EVRA) 150-35 MCG/24HR transdermal patch Place 1 patch onto the skin once a week. 01/23/16  Yes Rachelle A Denney, CNM  clindamycin (CLEOCIN) 300 MG capsule Take 1 capsule (300 mg total) by mouth 3 (three) times daily. 03/28/16  Kenzey Birkland Orlene OchM Jaquari Reckner, NP  predniSONE (DELTASONE) 10 MG tablet Take 2 tablets (20 mg total) by mouth 2 (two) times daily with a meal. 03/28/16   Duke Weisensel Orlene OchM Usama Harkless, NP   BP 114/76 mmHg  Pulse 120  Temp(Src) 101.3 F (38.5 C) (Oral)  Resp 22  Ht 5\' 11"  (1.803 m)  Wt 165.109 kg  BMI 50.79 kg/m2  SpO2 96% Physical Exam  Constitutional: She is oriented to person, place, and time. She appears well-developed and well-nourished.  HENT:  Head: Normocephalic and atraumatic.  Right Ear: Tympanic membrane normal.  Left Ear: Tympanic membrane normal.   Mouth/Throat: Uvula is midline. Uvula swelling present. Oropharyngeal exudate and posterior oropharyngeal erythema present. No tonsillar abscesses.  Eyes: Conjunctivae and EOM are normal. Pupils are equal, round, and reactive to light.  Neck: Normal range of motion. Neck supple.  Cardiovascular: Normal rate and regular rhythm.   Pulmonary/Chest: Effort normal and breath sounds normal. No respiratory distress.  Abdominal: Soft. There is no tenderness.  Musculoskeletal: Normal range of motion.  Lymphadenopathy:    She has cervical adenopathy.  Neurological: She is alert and oriented to person, place, and time.  Skin: Skin is warm and dry.  Psychiatric: She has a normal mood and affect. Her behavior is normal.  Nursing note and vitals reviewed.   ED Course  Procedures (including critical care time) DIAGNOSTIC STUDIES: Oxygen Saturation is 100% on RA, normal by my interpretation.    COORDINATION OF CARE: 9:51 PM-Discussed treatment plan which includes strep screen with pt at bedside and pt agreed to plan.    Labs Review Labs Reviewed  RAPID STREP SCREEN (NOT AT [facility])  CULTURE, GROUP A STREP [facility]   Dr. Juleen ChinaKohut in to examine the patient and does not feel that she has a tonsillar abscess at this time however, due to the severity of the tonsillitis we will treat with antibiotics and she will f/u with her ENT or return here for worsening symptoms.  Plan for Rocephin IM and Rx Clindamycin and prednisone.  MDM  We were initially going to give pt 1 g IM Rocephin and a prescription for Clindamycin for home. However, pt is allergic to penicillin, therefore, patient given Clindamycin 300 mg PO and decadron 10 mg IM prior to d/c. Stable for d/c without difficulty swallowing. She will continue tylenol and ibuprofen for pain and fever.    Final diagnoses:  Tonsillitis with exudate   I personally performed the services described in this documentation, which was scribed in my presence. The  recorded information has been reviewed and is accurate.    [address]Honorio Devol RoxanaM [name] [place], NP 03/29/16 [id number]  Raeford RazorStephen Kohut, MD 04/04/16 [phone]

## 2016-03-28 NOTE — Discharge Instructions (Signed)
Follow up with your ENT or return here for worsening symptoms.  Take the medication as directed. Take tylenol and ibuprofen as needed for fever and pain.  Tonsillitis Tonsillitis is an infection of the throat that causes the tonsils to become red, tender, and swollen. Tonsils are collections of lymphoid tissue at the back of the throat. Each tonsil has crevices (crypts). Tonsils help fight nose and throat infections and keep infection from spreading to other parts of the body for the first 18 months of life.  CAUSES Sudden (acute) tonsillitis is usually caused by infection with streptococcal bacteria. Long-lasting (chronic) tonsillitis occurs when the crypts of the tonsils become filled with pieces of food and bacteria, which makes it easy for the tonsils to become repeatedly infected. SYMPTOMS  Symptoms of tonsillitis include:  A sore throat, with possible difficulty swallowing.  White patches on the tonsils.  Fever.  Tiredness.  New episodes of snoring during sleep, when you did not snore before.  Small, foul-smelling, yellowish-white pieces of material (tonsilloliths) that you occasionally cough up or spit out. The tonsilloliths can also cause you to have bad breath. DIAGNOSIS Tonsillitis can be diagnosed through a physical exam. Diagnosis can be confirmed with the results of lab tests, including a throat culture. TREATMENT  The goals of tonsillitis treatment include the reduction of the severity and duration of symptoms and prevention of associated conditions. Symptoms of tonsillitis can be improved with the use of steroids to reduce the swelling. Tonsillitis caused by bacteria can be treated with antibiotic medicines. Usually, treatment with antibiotic medicines is started before the cause of the tonsillitis is known. However, if it is determined that the cause is not bacterial, antibiotic medicines will not treat the tonsillitis. If attacks of tonsillitis are severe and frequent, your  health care provider may recommend surgery to remove the tonsils (tonsillectomy). HOME CARE INSTRUCTIONS   Rest as much as possible and get plenty of sleep.  Drink plenty of fluids. While the throat is very sore, eat soft foods or liquids, such as sherbet, soups, or instant breakfast drinks.  Eat frozen ice pops.  Gargle with a warm or cold liquid to help soothe the throat. Mix 1/4 teaspoon of salt and 1/4 teaspoon of baking soda in 8 oz of water. SEEK MEDICAL CARE IF:   Large, tender lumps develop in your neck.  A rash develops.  A green, yellow-brown, or bloody substance is coughed up.  You are unable to swallow liquids or food for 24 hours.  You notice that only one of the tonsils is swollen. SEEK IMMEDIATE MEDICAL CARE IF:   You develop any new symptoms such as vomiting, severe headache, stiff neck, chest pain, or trouble breathing or swallowing.  You have severe throat pain along with drooling or voice changes.  You have severe pain, unrelieved with recommended medications.  You are unable to fully open the mouth.  You develop redness, swelling, or severe pain anywhere in the neck.  You have a fever. MAKE SURE YOU:   Understand these instructions.  Will watch your condition.  Will get help right away if you are not doing well or get worse.   This information is not intended to replace advice given to you by your health care provider. Make sure you discuss any questions you have with your health care provider.   Document Released: 08/07/2005 Document Revised: 11/18/2014 Document Reviewed: 04/16/2013 Elsevier Interactive Patient Education [employer]2016 [employer]

## 2016-03-28 NOTE — ED Notes (Signed)
Pt walked to fast track by emt

## 2016-03-28 NOTE — ED Notes (Signed)
Patient able to ambulate independently  

## 2016-04-01 LAB — CULTURE, GROUP A STREP (THRC)

## 2016-04-09 ENCOUNTER — Other Ambulatory Visit: Payer: Self-pay | Admitting: Certified Nurse Midwife

## 2016-04-09 DIAGNOSIS — Z30018 Encounter for initial prescription of other contraceptives: Secondary | ICD-10-CM

## 2016-04-09 MED ORDER — NORELGESTROMIN-ETH ESTRADIOL 150-35 MCG/24HR TD PTWK: 1.0000 | MEDICATED_PATCH | TRANSDERMAL | Status: DC

## 2016-04-12 ENCOUNTER — Ambulatory Visit: Payer: Self-pay | Admitting: Internal Medicine

## 2016-04-12 DIAGNOSIS — Z0289 Encounter for other administrative examinations: Secondary | ICD-10-CM

## 2016-06-21 ENCOUNTER — Ambulatory Visit (INDEPENDENT_AMBULATORY_CARE_PROVIDER_SITE_OTHER): Payer: 59 | Admitting: Certified Nurse Midwife

## 2016-06-21 ENCOUNTER — Encounter: Payer: Self-pay | Admitting: Certified Nurse Midwife

## 2016-06-21 VITALS — BP 124/85 | HR 102 | Wt 367.5 lb

## 2016-06-21 DIAGNOSIS — Z3009 Encounter for other general counseling and advice on contraception: Secondary | ICD-10-CM

## 2016-06-25 ENCOUNTER — Encounter: Payer: Self-pay | Admitting: Certified Nurse Midwife

## 2016-06-25 NOTE — Progress Notes (Signed)
Subjective:    Frances [name] is a 25 y.o. female who presents for contraception counseling. The patient has no complaints today. The patient is not currently sexually active. Pertinent past medical history: hypertension.  Discussed birth control options, patient does not want a device.  Has normal periods.  Is not currently sexually active at this time and desires to take a break from birth control.    The information documented in the HPI was reviewed and verified.  Menstrual History: OB History    Gravida Para Term Preterm AB Living   5 3 3  0 2 3   SAB TAB Ectopic Multiple Live Births   1 1     1        No LMP recorded.   Patient Active Problem List   Diagnosis Date Noted  . Obesity 01/23/2016  . Prediabetes 09/28/2014  . Hemorrhoids in pregnancy and puerperium, delivered 08/05/2013  . Pain aggravated by activities of daily living 07/22/2013  . FLUID OVERLOAD 02/12/2009  . ESSENTIAL HYPERTENSION, BENIGN 02/12/2009  . PERICARDIAL EFFUSION 02/12/2009  . VENTRICULAR HYPERTROPHY, LEFT 02/12/2009  . PRE-ECLAMP/ECLAMPSIA SUPERIMPOSED PRE-XST HTN PP 02/12/2009   Past Medical History:  Diagnosis Date  . Complication of anesthesia    with csections, felt like she "couldnt swallow during the delivery of baby"  . Headache(784.0)   . History of pre-eclampsia in prior pregnancy, currently pregnant   . No pertinent past medical history    preeclampsia with 2010 pregnancy  . Obesity (BMI 35.0-39.9 without comorbidity) (HCC)   . Pregnancy induced hypertension   . Shortness of breath    sob during the night at times  . Ventricular hypertrophy     Past Surgical History:  Procedure Laterality Date  . CESAREAN SECTION    . CESAREAN SECTION N/A 07/14/2013   Procedure: REPEAT CESAREAN SECTION;  Surgeon: Brock Badharles A Harper, MD;  Location: [facility];  Service: Obstetrics;  Laterality: N/A;     Current Outpatient Prescriptions:  .  norelgestromin-ethinyl estradiol (ORTHO EVRA) 150-35 MCG/24HR  transdermal patch, Place 1 patch onto the skin once a week. (Patient not taking: Reported on 06/21/2016), Disp: 3 patch, Rfl: 12 Allergies  Allergen Reactions  . Lisinopril Anaphylaxis  . Penicillins Hives    Social History  Substance Use Topics  . Smoking status: Current Every Day Smoker    Packs/day: 0.25    Years: 4.00    Types: Cigarettes    Last attempt to quit: 11/13/2012  . Smokeless tobacco: Never Used  . Alcohol use 0.0 oz/week     Comment: social    Family History  Problem Relation Age of Onset  . Stroke Maternal Uncle   . Diabetes Maternal Uncle   . Diabetes Paternal Grandmother        Review of Systems Constitutional: negative for weight loss Genitourinary:negative for abnormal menstrual periods and vaginal discharge   Objective:   BP 124/85   Pulse (!) 102   Wt (!) 367 lb 8 oz (166.7 kg)   BMI 51.26 kg/m    General:   alert  Skin:   no rash or abnormalities  Lungs:   clear to auscultation bilaterally  Heart:   regular rate and rhythm, S1, S2 normal, no murmur, click, rub or gallop  Breasts:   deferred  Abdomen:  normal findings: no organomegaly, soft, non-tender and no hernia obese  Pelvis:  deferred   Lab Review Urine pregnancy test Labs reviewed yes Radiologic studies reviewed no  100% of 15 min  visit spent on counseling and coordination of care.   Assessment:    25 y.o., discontinuing Ortho-Evra patches weekly, contraindications heart disease, hypertension, obesity.   Plan:   Discontinue birth control patch.  Abstinence planned for right now.    All questions answered.  No orders of the defined types were placed in this encounter.  No orders of the defined types were placed in this encounter.   Follow up as needed.

## 2016-10-31 ENCOUNTER — Telehealth: Payer: Self-pay

## 2016-10-31 NOTE — Telephone Encounter (Signed)
Returned call and patient stated that she has been off of birth control for months and her period has been regular, she now has missed a period for 2 months and has had multiple negative pregnancy tests. Patient wants to schedule an appt soon to talk to a provider. Transferred to scheduler.

## 2016-11-26 ENCOUNTER — Ambulatory Visit: Payer: Self-pay | Admitting: Obstetrics

## 2016-12-17 ENCOUNTER — Telehealth: Payer: Self-pay

## 2016-12-17 NOTE — Telephone Encounter (Signed)
Pt said she stopped BC in Aug and hasn't had a "real period" since the end of Dec. Spotting couple days mid Jan. She took multiple home UPT's all negative. [facility] and decrease acne. She wants to know when does she need to restart BC. Consulted with provider and pt needs an appt for further evaluation because we are unsure if the spotting she had in Jan, was a real cycle or beginning stage of pregnancy. Call transferred to appts. Pt declined next available appt. She also no showed 1[facility]16 [facility] that [facility].

## 2017-09-09 ENCOUNTER — Ambulatory Visit: Payer: Self-pay | Admitting: Obstetrics

## 2017-10-23 ENCOUNTER — Encounter (HOSPITAL_COMMUNITY): Payer: Self-pay

## 2017-10-23 ENCOUNTER — Inpatient Hospital Stay (HOSPITAL_COMMUNITY)
Admission: AD | Admit: 2017-10-23 | Discharge: 2017-10-24 | Disposition: A | Discharge status: Home / Self Care | Payer: 59 | Source: Ambulatory Visit | Attending: Obstetrics and Gynecology | Admitting: Obstetrics and Gynecology

## 2017-10-23 DIAGNOSIS — Z6835 Body mass index (BMI) 35.0-35.9, adult: Secondary | ICD-10-CM | POA: Diagnosis not present

## 2017-10-23 DIAGNOSIS — F17210 Nicotine dependence, cigarettes, uncomplicated: Secondary | ICD-10-CM | POA: Diagnosis not present

## 2017-10-23 DIAGNOSIS — N921 Excessive and frequent menstruation with irregular cycle: Secondary | ICD-10-CM | POA: Insufficient documentation

## 2017-10-23 DIAGNOSIS — E669 Obesity, unspecified: Secondary | ICD-10-CM | POA: Diagnosis not present

## 2017-10-23 DIAGNOSIS — N939 Abnormal uterine and vaginal bleeding, unspecified: Secondary | ICD-10-CM | POA: Diagnosis present

## 2017-10-23 LAB — URINALYSIS, ROUTINE W REFLEX MICROSCOPIC
BILIRUBIN URINE: NEGATIVE
Glucose, UA: NEGATIVE mg/dL
KETONES UR: NEGATIVE mg/dL
Nitrite: POSITIVE — AB
PH: 5 (ref 5.0–8.0)
Protein, ur: NEGATIVE mg/dL
Specific Gravity, Urine: 1.018 (ref 1.005–1.030)

## 2017-10-23 LAB — POCT PREGNANCY, URINE: PREG TEST UR: NEGATIVE

## 2017-10-23 NOTE — MAU Note (Signed)
Pt states she was on a birth control patch for 7 years between having kids and about a year and a half ago started having clots so she stopped using the patch.  She has been irregular since then.  Started bleeding two weeks ago after a normal period last month.  The first week was very bright red and heavy and it has slowed down but she says she has been bleeding for 14 days no bleeding today.  This past weekend she has been cramping terribly.  Bleeding started back around 2100 today.  Lower abdominal pain today and chest pain.

## 2017-10-24 ENCOUNTER — Other Ambulatory Visit: Payer: Self-pay | Admitting: Obstetrics and Gynecology

## 2017-10-24 DIAGNOSIS — N921 Excessive and frequent menstruation with irregular cycle: Secondary | ICD-10-CM | POA: Diagnosis not present

## 2017-10-24 MED ORDER — MEDROXYPROGESTERONE ACETATE 10 MG PO TABS: 20.0000 mg | ORAL_TABLET | Freq: Three times a day (TID) | ORAL | 0 refills | Status: DC

## 2017-10-24 NOTE — MAU Provider Note (Signed)
Chief Complaint: Vaginal Bleeding   SUBJECTIVE HPI: Frances [name] is a 26 y.o. [id number] who presents to MAU with complaints of irregular prolonged menstraul bleeding. Patient states her periods started becoming irregular after she gained some weight about a year ago. She was on the birth control patch but stopped using after she started developing clots. She has a period monthly but now there is intermenstrual bleeding and prolonged bleeding. She is currently bleeding and has been on this cycle for the last 2 weeks. She has associated abdominal cramping. She wants to know what is causing her irregualr bleeding and get treated. Denies fevers/chills, n/v, dyspnea, chest pain, dizziness, and vaginal discharge. Has had some increased fatigue.   Past Medical History:  Diagnosis Date  . Complication of anesthesia    with csections, felt like she "couldnt swallow during the delivery of baby"  . Headache(784.0)   . History of pre-eclampsia in prior pregnancy, currently pregnant   . No pertinent past medical history    preeclampsia with 2010 pregnancy  . Obesity (BMI 35.0-39.9 without comorbidity)   . Pregnancy induced hypertension   . Shortness of breath    sob during the night at times  . Ventricular hypertrophy    OB History  Gravida Para Term Preterm AB Living  4 3 3  0 1 3  SAB TAB Ectopic Multiple Live Births  0 1     1    # Outcome Date GA Lbr Len/2nd Weight Sex Delivery Anes PTL Lv  4 Term 07/14/13 1526w5d  3.91 kg (8 lb 9.9 oz) M CS-LTranv Spinal  LIV  3 Term 2011 4468w0d    CS-Unspec     2 Term 2010 4468w0d    CS-Unspec     1 TAB 2005             Past Surgical History:  Procedure Laterality Date  . CESAREAN SECTION    . CESAREAN SECTION N/A 07/14/2013   Procedure: REPEAT CESAREAN SECTION;  Surgeon: Brock Badharles A Harper, MD;  Location: [facility];  Service: Obstetrics;  Laterality: N/A;   Social History   Socioeconomic History  . Marital status: Single    Spouse name: Not on file  .  Number of children: 2  . Years of education: Not on file  . Highest education level: Not on file  Social Needs  . Financial resource strain: Not on file  . Food insecurity - worry: Not on file  . Food insecurity - inability: Not on file  . Transportation needs - medical: Not on file  . Transportation needs - non-medical: Not on file  Occupational History  . Not on file  Tobacco Use  . Smoking status: Current Every Day Smoker    Packs/day: 0.25    Years: 4.00    Pack years: 1.00    Types: Cigarettes    Last attempt to quit: 11/13/2012    Years since quitting: 4.9  . Smokeless tobacco: Never Used  Substance and Sexual Activity  . Alcohol use: Yes    Alcohol/week: 0.0 oz    Comment: social  . Drug use: No    Comment: Former Marijuana use  . Sexual activity: Yes    Birth control/protection: None  Other Topics Concern  . Not on file  Social History Narrative  . Not on file   No current facility-administered medications on file prior to encounter.    No current outpatient medications on file prior to encounter.   Allergies  Allergen Reactions  .  Lisinopril Anaphylaxis  . Penicillins Hives    I have reviewed the past Medical Hx, Surgical Hx, Social Hx, Allergies and Medications.   REVIEW OF SYSTEMS All systems reviewed and are negative for acute change except as noted in the HPI.   OBJECTIVE BP 121/64 (BP Location: Left Arm)   Pulse 90   Temp 98.1 F (36.7 C) (Oral)   Resp 17   Ht 5\' 9"  (1.753 m)   Wt (!) 173.3 kg (382 lb)   SpO2 99%   BMI 56.41 kg/m    PHYSICAL EXAM Constitutional: Well-developed, well-nourished female in no acute distress. Obese. Cardiovascular: normal rate and rhythm, pulses intact Respiratory: normal rate and effort.  GI: Abd soft, non-tender, non-distended. Uterus feels enlarged on palpitation of abdomen. Pos BS x 4 MS: Extremities nontender, no edema, normal ROM Neurologic: Alert and oriented x 4. No focal deficits Pelvic exam  defered Psych: normal mood and affect  LAB RESULTS Results for orders placed or performed during the hospital encounter of 10/23/17 (from the past 24 hour(s))  Urinalysis, Routine w reflex microscopic     Status: Abnormal   Collection Time: 10/23/17 10:45 PM  Result Value Ref Range   Color, Urine YELLOW YELLOW   APPearance CLEAR CLEAR   Specific Gravity, Urine 1.018 1.005 - 1.030   pH 5.0 5.0 - 8.0   Glucose, UA NEGATIVE NEGATIVE mg/dL   Hgb urine dipstick LARGE (A) NEGATIVE   Bilirubin Urine NEGATIVE NEGATIVE   Ketones, ur NEGATIVE NEGATIVE mg/dL   Protein, ur NEGATIVE NEGATIVE mg/dL   Nitrite POSITIVE (A) NEGATIVE   Leukocytes, UA TRACE (A) NEGATIVE   RBC / HPF 6-30 0 - 5 RBC/hpf   WBC, UA 6-30 0 - 5 WBC/hpf   Bacteria, UA RARE (A) NONE SEEN   Squamous Epithelial / LPF 0-5 (A) NONE SEEN   Mucus PRESENT   Pregnancy, urine POC     Status: None   Collection Time: 10/23/17 11:04 PM  Result Value Ref Range   Preg Test, Ur NEGATIVE NEGATIVE    IMAGING No results found.  MAU COURSE Vitals and nursing notes reviewed Hemodynamically stable Not pregnant No need for emergent US. Discussed this with patient. Will need an outpatient work-up.  Treatments given in MAU: None  MDM Plan of care reviewed with patient, including labs and tests ordered and medical treatment.   ASSESSMENT 1. Menometrorrhagia     PLAN Discharge home in stable condition. Rx for Provera to help with bleeding List of OBGYNs given Reassurance provided Prn NASIDs for abdominal cramping Discussed how this is an outpatient work-up Counseled on return precautions Handout given   Caryl AdaJazma Leanor Voris, DO OB Fellow Faculty Practice, [facility]

## 2017-10-24 NOTE — Discharge Instructions (Signed)
Follow-up with OB/GYn provider (list provided)  Need work-up we cant provide in MAU  Medication given tos top bleeding  Need birth control to help with regulation  Dysfunctional Uterine Bleeding Dysfunctional uterine bleeding is abnormal bleeding from the uterus. Dysfunctional uterine bleeding includes:  A period that comes earlier or later than usual.  A period that is lighter, heavier, or has blood clots.  Bleeding between periods.  Skipping one or more periods.  Bleeding after sexual intercourse.  Bleeding after menopause.  Follow these instructions at home: Pay attention to any changes in your symptoms. Follow these instructions to help with your condition: Eating and drinking  Eat well-balanced meals. Include foods that are high in iron, such as liver, meat, shellfish, green leafy vegetables, and eggs.  If you become constipated: ? Drink plenty of water. ? Eat fruits and vegetables that are high in water and fiber, such as spinach, carrots, raspberries, apples, and mango. Medicines  Take over-the-counter and prescription medicines only as told by your health care provider.  Do not change medicines without talking with your health care provider.  Aspirin or medicines that contain aspirin may make the bleeding worse. Do not take those medicines: ? During the week before your period. ? During your period.  If you were prescribed iron pills, take them as told by your health care provider. Iron pills help to replace iron that your body loses because of this condition. Activity  If you need to change your sanitary pad or tampon more than one time every 2 hours: ? Lie in bed with your feet raised (elevated). ? Place a cold pack on your lower abdomen. ? Rest as much as possible until the bleeding stops or slows down.  Do not try to lose weight until the bleeding has stopped and your blood iron level is back to normal. Other Instructions  For two months, write  down: ? When your period starts. ? When your period ends. ? When any abnormal bleeding occurs. ? What problems you notice.  Keep all follow up visits as told by your health care provider. This is important. Contact a health care provider if:  You get light-headed or weak.  You have nausea and vomiting.  You cannot eat or drink without vomiting.  You feel dizzy or have diarrhea while you are taking medicines.  You are taking birth control pills or hormones, and you want to change them or stop taking them. Get help right away if:  You develop a fever or chills.  You need to change your sanitary pad or tampon more than one time per hour.  Your bleeding becomes heavier, or your flow contains clots more often.  You develop pain in your abdomen.  You lose consciousness.  You develop a rash. This information is not intended to replace advice given to you by your health care provider. Make sure you discuss any questions you have with your health care provider. Document Released: 10/25/2000 Document Revised: 04/04/2016 Document Reviewed: 01/23/2015 Elsevier Interactive Patient Education  2018 [employer]    Intrauterine Device Information An intrauterine device (IUD) is inserted into your uterus to prevent pregnancy. There are two types of IUDs available:  Copper IUD--This type of IUD is wrapped in copper wire and is placed inside the uterus. Copper makes the uterus and fallopian tubes produce a fluid that kills sperm. The copper IUD can stay in place for 10 years.  Hormone IUD--This type of IUD contains the hormone progestin (synthetic progesterone).  The hormone thickens the cervical mucus and prevents sperm from entering the uterus. It also thins the uterine lining to prevent implantation of a fertilized egg. The hormone can weaken or kill the sperm that get into the uterus. One type of hormone IUD can stay in place for 5 years, and another type can stay in place for 3  years.  Your health care provider will make sure you are a good candidate for a contraceptive IUD. Discuss with your health care provider the possible side effects. Advantages of an intrauterine device  IUDs are highly effective, reversible, long acting, and low maintenance.  There are no estrogen-related side effects.  An IUD can be used when breastfeeding.  IUDs are not associated with weight gain.  The copper IUD works immediately after insertion.  The hormone IUD works right away if inserted within 7 days of your period starting. You will need to use a backup method of birth control for 7 days if the hormone IUD is inserted at any other time in your cycle.  The copper IUD does not interfere with your female hormones.  The hormone IUD can make heavy menstrual periods lighter and decrease cramping.  The hormone IUD can be used for 3 or 5 years.  The copper IUD can be used for 10 years. Disadvantages of an intrauterine device  The hormone IUD can be associated with irregular bleeding patterns.  The copper IUD can make your menstrual flow heavier and more painful.  You may experience cramping and vaginal bleeding after insertion. This information is not intended to replace advice given to you by your health care provider. Make sure you discuss any questions you have with your health care provider. Document Released: 10/01/2004 Document Revised: 04/04/2016 Document Reviewed: 04/18/2013 Elsevier Interactive Patient Education  2017 [employer]

## 2017-11-15 ENCOUNTER — Inpatient Hospital Stay (HOSPITAL_COMMUNITY)
Admission: AD | Admit: 2017-11-15 | Discharge: 2017-11-15 | Disposition: A | Discharge status: Home / Self Care | Payer: 59 | Source: Ambulatory Visit | Attending: Obstetrics & Gynecology | Admitting: Obstetrics & Gynecology

## 2017-11-15 ENCOUNTER — Encounter (HOSPITAL_COMMUNITY): Payer: Self-pay

## 2017-11-15 DIAGNOSIS — R42 Dizziness and giddiness: Secondary | ICD-10-CM | POA: Insufficient documentation

## 2017-11-15 DIAGNOSIS — N946 Dysmenorrhea, unspecified: Secondary | ICD-10-CM

## 2017-11-15 DIAGNOSIS — N939 Abnormal uterine and vaginal bleeding, unspecified: Secondary | ICD-10-CM | POA: Insufficient documentation

## 2017-11-15 DIAGNOSIS — E669 Obesity, unspecified: Secondary | ICD-10-CM | POA: Insufficient documentation

## 2017-11-15 DIAGNOSIS — F17210 Nicotine dependence, cigarettes, uncomplicated: Secondary | ICD-10-CM | POA: Diagnosis not present

## 2017-11-15 DIAGNOSIS — Z880 Allergy status to penicillin: Secondary | ICD-10-CM | POA: Diagnosis not present

## 2017-11-15 DIAGNOSIS — Z888 Allergy status to other drugs, medicaments and biological substances status: Secondary | ICD-10-CM | POA: Diagnosis not present

## 2017-11-15 DIAGNOSIS — Z833 Family history of diabetes mellitus: Secondary | ICD-10-CM | POA: Insufficient documentation

## 2017-11-15 DIAGNOSIS — Z79899 Other long term (current) drug therapy: Secondary | ICD-10-CM | POA: Diagnosis not present

## 2017-11-15 DIAGNOSIS — Z8249 Family history of ischemic heart disease and other diseases of the circulatory system: Secondary | ICD-10-CM | POA: Insufficient documentation

## 2017-11-15 DIAGNOSIS — Z823 Family history of stroke: Secondary | ICD-10-CM | POA: Insufficient documentation

## 2017-11-15 DIAGNOSIS — Z9889 Other specified postprocedural states: Secondary | ICD-10-CM | POA: Insufficient documentation

## 2017-11-15 LAB — CBC
HEMATOCRIT: 34.7 % — AB (ref 36.0–46.0)
HEMOGLOBIN: 11.5 g/dL — AB (ref 12.0–15.0)
MCH: 28.6 pg (ref 26.0–34.0)
MCHC: 33.1 g/dL (ref 30.0–36.0)
MCV: 86.3 fL (ref 78.0–100.0)
Platelets: 287 10*3/uL (ref 150–400)
RBC: 4.02 MIL/uL (ref 3.87–5.11)
RDW: 14.1 % (ref 11.5–15.5)
WBC: 11.1 10*3/uL — AB (ref 4.0–10.5)

## 2017-11-15 LAB — HCG, SERUM, QUALITATIVE: Preg, Serum: NEGATIVE

## 2017-11-15 MED ORDER — ONDANSETRON 8 MG PO TBDP
8.0000 mg | ORAL_TABLET | Freq: Once | ORAL | Status: AC
  Administered 2017-11-15: 8 mg via ORAL
  Filled 2017-11-15: qty 1

## 2017-11-15 MED ORDER — MEGESTROL ACETATE 20 MG PO TABS: 40.0000 mg | ORAL_TABLET | Freq: Two times a day (BID) | ORAL | 1 refills | Status: DC

## 2017-11-15 MED ORDER — ONDANSETRON HCL 4 MG PO TABS: 4.0000 mg | ORAL_TABLET | Freq: Four times a day (QID) | ORAL | 0 refills | Status: DC

## 2017-11-15 MED ORDER — IBUPROFEN 600 MG PO TABS: 600.0000 mg | ORAL_TABLET | Freq: Four times a day (QID) | ORAL | 0 refills | Status: DC | PRN

## 2017-11-15 NOTE — MAU Note (Signed)
Orthostatic vitals obtained. During standing, pt became lightheaded and diaphoretic. Pt back to bed. After sitting back in bed and cool cloth applied, pt felt as if symptoms had improved. MAU provider notified of pt symptoms during orthostatic vs.

## 2017-11-15 NOTE — Discharge Instructions (Signed)

## 2017-11-15 NOTE — MAU Note (Addendum)
Pt states she was seen a few weeks ago for heavy vaginal bleeding and passing large clots. States she was put on medication for vaginal bleeding. And it stopped bleeding for a week. States she has been vaginal bleeding for 2 weeks. States she has passed multiple clots the size of a 50 cent piece. Reports changing pad every 2 hours. States she called doctor for a closer follow up and the earliest she could get in was in 1 week. States she started feeling like she was going to pass out at work today and decided to come in. Reports lower abdominal cramping and nausea.

## 2017-11-15 NOTE — MAU Provider Note (Signed)
History     CSN: [id number]  Arrival date and time: 11/15/17 [id number]   First Provider Initiated Contact with Patient 11/15/17 0126      Chief Complaint  Patient presents with  . Vaginal Bleeding   HPI Ms. Frances [name] is a 27 y.o. [phone] who presents to MAU today with complaint of heavy vaginal bleeding. The patient states that she had a similar episode a few weeks ago after she discontinued birth control. She was given Provera. She states that it stopped the bleeding, but it resumed 1.5 weeks ago. She has been having heavy bleeding and passing clots. She felt dizzy tonight at work which is what brought her in. She has also been having a pulling, cramping feeling.   OB History    Gravida Para Term Preterm AB Living   4 3 3  0 1 3   SAB TAB Ectopic Multiple Live Births   0 1     1      Past Medical History:  Diagnosis Date  . Complication of anesthesia    with csections, felt like she "couldnt swallow during the delivery of baby"  . Headache(784.0)   . History of pre-eclampsia in prior pregnancy, currently pregnant   . No pertinent past medical history    preeclampsia with 2010 pregnancy  . Obesity (BMI 35.0-39.9 without comorbidity)   . Pregnancy induced hypertension   . Shortness of breath    sob during the night at times  . Ventricular hypertrophy     Past Surgical History:  Procedure Laterality Date  . CESAREAN SECTION    . CESAREAN SECTION N/A 07/14/2013   Procedure: REPEAT CESAREAN SECTION;  Surgeon: Brock Badharles A Harper, MD;  Location: [facility];  Service: Obstetrics;  Laterality: N/A;    Family History  Problem Relation Age of Onset  . Stroke Maternal Uncle   . Diabetes Maternal Uncle   . Diabetes Paternal Grandmother   . Hypertension Mother     Social History   Tobacco Use  . Smoking status: Current Every Day Smoker    Packs/day: 0.25    Years: 4.00    Pack years: 1.00    Types: Cigarettes    Last attempt to quit: 11/13/2012    Years since quitting: 5.0   . Smokeless tobacco: Never Used  Substance Use Topics  . Alcohol use: Yes    Alcohol/week: 0.0 oz    Comment: social  . Drug use: No    Comment: Former Marijuana use    Allergies:  Allergies  Allergen Reactions  . Lisinopril Anaphylaxis  . Penicillins Hives    Medications Prior to Admission  Medication Sig Dispense Refill Last Dose  . medroxyPROGESTERone (PROVERA) 10 MG tablet Take 2 tablets (20 mg total) by mouth 3 (three) times daily for 7 days. 42 tablet 0     Review of Systems  Constitutional: Negative for fever.  Gastrointestinal: Positive for abdominal pain. Negative for constipation, diarrhea, nausea and vomiting.  Genitourinary: Positive for vaginal bleeding. Negative for dysuria, frequency, urgency and vaginal discharge.  Neurological: Positive for dizziness and light-headedness. Negative for syncope.   Physical Exam   Blood pressure 139/71, pulse (!) 118, temperature 98.6 F (37 C), temperature source Oral, resp. rate (!) 22, height 5' 9.5" (1.765 m), weight (!) 380 lb (172.4 kg), SpO2 99 %.  Physical Exam  Nursing note and vitals reviewed. Constitutional: She is oriented to person, place, and time. She appears well-developed and well-nourished. No distress.  HENT:  Head:  Normocephalic and atraumatic.  Cardiovascular: Tachycardia present.  Respiratory: Effort normal.  GI: Soft. She exhibits no distension and no mass. There is no tenderness. There is no rebound and no guarding.  Neurological: She is alert and oriented to person, place, and time.  Skin: Skin is warm and dry. No erythema.  Psychiatric: She has a normal mood and affect.    Results for orders placed or performed during the hospital encounter of 11/15/17 (from the past 24 hour(s))  CBC     Status: Abnormal   Collection Time: 11/15/17  1:28 AM  Result Value Ref Range   WBC 11.1 (H) 4.0 - 10.5 K/uL   RBC 4.02 3.87 - 5.11 MIL/uL   Hemoglobin 11.5 (L) 12.0 - 15.0 g/dL   HCT 16.1 (L) 09.6 - 04.5 %    MCV 86.3 78.0 - 100.0 fL   MCH 28.6 26.0 - 34.0 pg   MCHC 33.1 30.0 - 36.0 g/dL   RDW 40.9 81.1 - 91.4 %   Platelets 287 150 - 400 K/uL  hCG, serum, qualitative     Status: None   Collection Time: 11/15/17  1:28 AM  Result Value Ref Range   Preg, Serum NEGATIVE NEGATIVE    MAU Course  Procedures  MDM CBC, orthostatic VS today  Unable to void upon arrival, hCG qualitative ordered  VSS. Patient is hemodynamically stable for discharge at this time.  Assessment and Plan  A: Abnormal uterine bleeding   P: Discharge home Rx for Zofran, Ibuprofen and Megace given to patient  Bleeding precautions discussed Patient advised to follow-up with [facility] as scheduled or sooner PRN Patient may return to MAU as needed or if her condition were to change or worsen   Vonzella Nipple, PA-C 11/15/2017, 2:28 AM

## 2017-11-21 ENCOUNTER — Ambulatory Visit (HOSPITAL_COMMUNITY)
Admission: RE | Admit: 2017-11-21 | Discharge: 2017-11-21 | Disposition: A | Discharge status: Home / Self Care | Payer: 59 | Source: Ambulatory Visit | Attending: Medical | Admitting: Medical

## 2017-11-21 DIAGNOSIS — N939 Abnormal uterine and vaginal bleeding, unspecified: Secondary | ICD-10-CM | POA: Diagnosis not present

## 2017-11-21 DIAGNOSIS — N946 Dysmenorrhea, unspecified: Secondary | ICD-10-CM | POA: Diagnosis not present

## 2017-12-15 ENCOUNTER — Encounter: Payer: Self-pay | Admitting: Obstetrics and Gynecology

## 2017-12-15 ENCOUNTER — Encounter: Payer: 59 | Admitting: Obstetrics and Gynecology

## 2017-12-16 NOTE — Progress Notes (Signed)
Patient did not keep GYN appointment for 12/15/2017.  Seriyah Collison, Jr MD Attending [facility] (Faculty Practice)   

## 2018-02-12 ENCOUNTER — Ambulatory Visit (INDEPENDENT_AMBULATORY_CARE_PROVIDER_SITE_OTHER): Payer: 59 | Admitting: Obstetrics

## 2018-02-12 ENCOUNTER — Encounter: Payer: Self-pay | Admitting: Obstetrics

## 2018-02-12 VITALS — BP 147/97 | HR 100 | Ht 70.0 in | Wt 384.0 lb

## 2018-02-12 DIAGNOSIS — Z3009 Encounter for other general counseling and advice on contraception: Secondary | ICD-10-CM | POA: Diagnosis not present

## 2018-02-12 DIAGNOSIS — E282 Polycystic ovarian syndrome: Secondary | ICD-10-CM | POA: Diagnosis not present

## 2018-02-12 DIAGNOSIS — D500 Iron deficiency anemia secondary to blood loss (chronic): Secondary | ICD-10-CM

## 2018-02-12 DIAGNOSIS — N939 Abnormal uterine and vaginal bleeding, unspecified: Secondary | ICD-10-CM | POA: Diagnosis not present

## 2018-02-12 DIAGNOSIS — Z6841 Body Mass Index (BMI) 40.0 and over, adult: Secondary | ICD-10-CM

## 2018-02-12 DIAGNOSIS — Z30011 Encounter for initial prescription of contraceptive pills: Secondary | ICD-10-CM

## 2018-02-12 MED ORDER — NORGESTIMATE-ETH ESTRADIOL 0.25-35 MG-MCG PO TABS: 1.0000 | ORAL_TABLET | Freq: Every day | ORAL | 11 refills | Status: DC

## 2018-02-12 MED ORDER — METFORMIN HCL ER 500 MG PO TB24: 1500.0000 mg | ORAL_TABLET | Freq: Every day | ORAL | 11 refills | Status: DC

## 2018-02-12 MED ORDER — FERROUS SULFATE 325 (65 FE) MG PO TABS: 325.0000 mg | ORAL_TABLET | Freq: Two times a day (BID) | ORAL | 5 refills | Status: DC

## 2018-02-12 NOTE — Progress Notes (Signed)
Patient ID: Frances [name], female   DOB: December 27, 1990, 27 y.o.   MRN: [id number]  Chief Complaint  Patient presents with  . Gynecologic Exam    HPI Frances [name] is a 27 y.o. female.  HAS IRREGULAR AND HEAVY CYCLES.  Recent ultrasound revealed features of PCOS.  Previously on Patch for contraception, but stopped, and cycles have been irregular since then. HPI  Past Medical History:  Diagnosis Date  . Complication of anesthesia    with csections, felt like she "couldnt swallow during the delivery of baby"  . Headache(784.0)   . History of pre-eclampsia in prior pregnancy, currently pregnant   . No pertinent past medical history    preeclampsia with 2010 pregnancy  . Obesity (BMI 35.0-39.9 without comorbidity)   . Pregnancy induced hypertension   . Shortness of breath    sob during the night at times  . Ventricular hypertrophy     Past Surgical History:  Procedure Laterality Date  . CESAREAN SECTION    . CESAREAN SECTION N/A 07/14/2013   Procedure: REPEAT CESAREAN SECTION;  Surgeon: Brock Badharles A Charlane Westry, MD;  Location: [facility];  Service: Obstetrics;  Laterality: N/A;    Family History  Problem Relation Age of Onset  . Stroke Maternal Uncle   . Diabetes Maternal Uncle   . Diabetes Paternal Grandmother   . Hypertension Mother     Social History Social History   Tobacco Use  . Smoking status: Current Every Day Smoker    Packs/day: 0.25    Years: 4.00    Pack years: 1.00    Types: Cigarettes    Last attempt to quit: 11/13/2012    Years since quitting: 5.2  . Smokeless tobacco: Never Used  Substance Use Topics  . Alcohol use: Yes    Alcohol/week: 0.0 oz    Comment: social  . Drug use: No    Comment: Former Marijuana use    Allergies  Allergen Reactions  . Lisinopril Anaphylaxis  . Penicillins Hives    Current Outpatient Medications  Medication Sig Dispense Refill  . megestrol (MEGACE) 20 MG tablet Take 2 tablets (40 mg total) by mouth 2 (two) times daily. 120  tablet 1  . ferrous sulfate 325 (65 FE) MG tablet Take 1 tablet (325 mg total) by mouth 2 (two) times daily with a meal. 60 tablet 5  . ibuprofen (ADVIL,MOTRIN) 600 MG tablet Take 1 tablet (600 mg total) by mouth every 6 (six) hours as needed. (Patient not taking: Reported on 02/12/2018) 30 tablet 0  . metFORMIN (GLUCOPHAGE XR) 500 MG 24 hr tablet Take 3 tablets (1,500 mg total) by mouth daily after supper. 90 tablet 11  . norgestimate-ethinyl estradiol (ORTHO-CYCLEN,SPRINTEC,PREVIFEM) 0.25-35 MG-MCG tablet Take 1 tablet by mouth daily. 1 Package 11  . ondansetron (ZOFRAN) 4 MG tablet Take 1 tablet (4 mg total) by mouth every 6 (six) hours. (Patient not taking: Reported on 02/12/2018) 12 tablet 0   No current facility-administered medications for this visit.     Review of Systems Review of Systems Constitutional: negative for fatigue and weight loss Respiratory: negative for cough and wheezing Cardiovascular: negative for chest pain, fatigue and palpitations Gastrointestinal: negative for abdominal pain and change in bowel habits Genitourinary:negative Integument/breast: negative for nipple discharge Musculoskeletal:negative for myalgias Neurological: negative for gait problems and tremors Behavioral/Psych: negative for abusive relationship, depression Endocrine: negative for temperature intolerance      Blood pressure (!) 147/97, pulse 100, height 5\' 10"  (1.778 m), weight (!) 384 lb (  174.2 kg), last menstrual period 02/01/2018.  Physical Exam Physical Exam:  Deferred  50% of 15 min visit spent on counseling and coordination of care.   Data Reviewed Labs Results for CALLIE, FACEY (MRN [id number]) as of 02/12/2018 16:55  Ref. Range 11/15/2017 01:28  WBC Latest Ref Range: 4.0 - 10.5 K/uL 11.1 (H)  RBC Latest Ref Range: 3.87 - 5.11 MIL/uL 4.02  Hemoglobin Latest Ref Range: 12.0 - 15.0 g/dL 40.9 (L)  HCT Latest Ref Range: 36.0 - 46.0 % 34.7 (L)  MCV Latest Ref Range: 78.0 - 100.0 fL 86.3   MCH Latest Ref Range: 26.0 - 34.0 pg 28.6  MCHC Latest Ref Range: 30.0 - 36.0 g/dL 81.1  RDW Latest Ref Range: 11.5 - 15.5 % 14.1  Platelets Latest Ref Range: 150 - 400 K/uL 287  Preg, Serum Latest Ref Range: NEGATIVE  NEGATIVE   Ultrasound US PELVIC COMPLETE WITH TRANSVAGINAL (Accession [id number]) (Order [id number])  Imaging  Date: 11/21/2017 Department: THE [facility] OF  ULTRASOUND Released By: Genevie Cheshire, RT Authorizing: Marny Lowenstein, PA-C  Exam Information   Status Exam Begun  Exam Ended   Final [99] 11/21/2017 3:41 PM 11/21/2017 4:15 PM  PACS Images   Show images for US PELVIC COMPLETE WITH TRANSVAGINAL  Study Result   CLINICAL DATA:  Abnormal uterine bleeding for months, dysmenorrhea  EXAM: TRANSABDOMINAL AND TRANSVAGINAL ULTRASOUND OF PELVIS  TECHNIQUE: Both transabdominal and transvaginal ultrasound examinations of the pelvis were performed. Transabdominal technique was performed for global imaging of the pelvis including uterus, ovaries, adnexal regions, and pelvic cul-de-sac. It was necessary to proceed with endovaginal exam following the transabdominal exam to visualize the uterus, endometrium and ovaries.  COMPARISON:  None  FINDINGS: Uterus  Measurements: 8.5 x 4.0 x 6.0 cm. Slightly retroflexed. No focal mass lesion.  Endometrium  Thickness: 8 mm thick, normal. No endometrial fluid or focal abnormality  Right ovary  Not visualized on either transabdominal or endovaginal imaging question due to obscuration by bowel and body habitus.  Left ovary  Measurements: 3.4 x 1.6 x 2.3 cm. Normal morphology without mass. Blood flow present within LEFT ovary on color Doppler imaging.  Other findings  No free pelvic fluid or adnexal masses.  IMPRESSION: Nonvisualization of RIGHT ovary.  Otherwise normal exam.   Electronically Signed   By: Ulyses Southward M.D.   On: 11/21/2017 17:39    Assessment     1.  Abnormal uterine bleeding (AUB) - from obesity and resultant PCOS  2. Class 3 severe obesity due to excess calories without serious comorbidity with body mass index (BMI) of 50.0 to 59.9 in adult [facility]) - recommended program of caloric reduction, exercise and behavioral modification  3. PCOS (polycystic ovarian syndrome) Rx: - Testosterone Free with SHBG - Hemoglobin A1c - metFORMIN (GLUCOPHAGE XR) 500 MG 24 hr tablet; Take 3 tablets (1,500 mg total) by mouth daily after supper.  Dispense: 90 tablet; Refill: 11  4. Iron deficiency anemia due to chronic blood loss Rx: - CBC - Ferritin - ferrous sulfate 325 (65 FE) MG tablet; Take 1 tablet (325 mg total) by mouth 2 (two) times daily with a meal.  Dispense: 60 tablet; Refill: 5  5. Encounter for other general counseling or advice on contraception - agreed to start OCP's  6. Encounter for initial prescription of contraceptive pills Rx: - norgestimate-ethinyl estradiol (ORTHO-CYCLEN,SPRINTEC,PREVIFEM) 0.25-35 MG-MCG tablet; Take 1 tablet by mouth daily.  Dispense: 1 Package; Refill: 11    Plan  Follow up in 3 months  Orders Placed This Encounter  Procedures  . CBC  . Testosterone Free with SHBG  . Ferritin  . Hemoglobin A1c   Meds ordered this encounter  Medications  . metFORMIN (GLUCOPHAGE XR) 500 MG 24 hr tablet    Sig: Take 3 tablets (1,500 mg total) by mouth daily after supper.    Dispense:  90 tablet    Refill:  11  . ferrous sulfate 325 (65 FE) MG tablet    Sig: Take 1 tablet (325 mg total) by mouth 2 (two) times daily with a meal.    Dispense:  60 tablet    Refill:  5  . norgestimate-ethinyl estradiol (ORTHO-CYCLEN,SPRINTEC,PREVIFEM) 0.25-35 MG-MCG tablet    Sig: Take 1 tablet by mouth daily.    Dispense:  1 Package    Refill:  11     Brock Bad MD 02-12-2018

## 2018-02-12 NOTE — Progress Notes (Signed)
Patient states she has been off BC patch for about 2 years and has irregular heavy cycles, LMP 02-01-18. Pt had U/S on 11-21-17. Pt declined pap today due to heavy bleeding.

## 2018-02-19 ENCOUNTER — Other Ambulatory Visit: Payer: Self-pay | Admitting: Obstetrics

## 2018-02-19 ENCOUNTER — Telehealth: Payer: Self-pay

## 2018-02-19 DIAGNOSIS — E139 Other specified diabetes mellitus without complications: Secondary | ICD-10-CM

## 2018-02-19 LAB — TESTOSTERONE, FREE AND TOTAL (INCLUDES SHBG)-(MALES)
% Free Testosterone: 1.7 %
FREE TESTOSTERONE, S: 4.6 pg/mL
Sex Hormone Binding Globulin: 25 nmol/L
Testosterone, Serum (Total): 27 ng/dL

## 2018-02-19 LAB — CBC
Hematocrit: 35.4 % (ref 34.0–46.6)
Hemoglobin: 11.7 g/dL (ref 11.1–15.9)
MCH: 28 pg (ref 26.6–33.0)
MCHC: 33.1 g/dL (ref 31.5–35.7)
MCV: 85 fL (ref 79–97)
Platelets: 341 10*3/uL (ref 150–379)
RBC: 4.18 x10E6/uL (ref 3.77–5.28)
RDW: 14.1 % (ref 12.3–15.4)
WBC: 9 10*3/uL (ref 3.4–10.8)

## 2018-02-19 LAB — HEMOGLOBIN A1C
ESTIMATED AVERAGE GLUCOSE: 203 mg/dL
Hgb A1c MFr Bld: 8.7 % — ABNORMAL HIGH (ref 4.8–5.6)

## 2018-02-19 LAB — FERRITIN: Ferritin: 77 ng/mL (ref 15–150)

## 2018-02-19 NOTE — Progress Notes (Signed)
Patient notified

## 2018-02-19 NOTE — Telephone Encounter (Signed)
-----   Message from Brock Badharles A Harper, MD sent at 02/19/2018  1:16 PM EDT ----- Abnormal HgbA1c.  Referred to Internal Medicine

## 2018-02-19 NOTE — Telephone Encounter (Signed)
Patient notified of abnormal Hgb A1c and referral.  She verbalized understanding.

## 2018-03-07 ENCOUNTER — Encounter (HOSPITAL_COMMUNITY): Payer: Self-pay

## 2018-03-07 ENCOUNTER — Other Ambulatory Visit: Payer: Self-pay

## 2018-03-07 ENCOUNTER — Emergency Department (HOSPITAL_COMMUNITY)
Admission: EM | Admit: 2018-03-07 | Discharge: 2018-03-08 | Disposition: A | Discharge status: Home / Self Care | Payer: 59 | Attending: Emergency Medicine | Admitting: Emergency Medicine

## 2018-03-07 DIAGNOSIS — R05 Cough: Secondary | ICD-10-CM | POA: Diagnosis present

## 2018-03-07 DIAGNOSIS — F17210 Nicotine dependence, cigarettes, uncomplicated: Secondary | ICD-10-CM | POA: Diagnosis not present

## 2018-03-07 DIAGNOSIS — Z7984 Long term (current) use of oral hypoglycemic drugs: Secondary | ICD-10-CM | POA: Insufficient documentation

## 2018-03-07 DIAGNOSIS — B9789 Other viral agents as the cause of diseases classified elsewhere: Secondary | ICD-10-CM | POA: Diagnosis not present

## 2018-03-07 DIAGNOSIS — Z79899 Other long term (current) drug therapy: Secondary | ICD-10-CM | POA: Insufficient documentation

## 2018-03-07 DIAGNOSIS — I10 Essential (primary) hypertension: Secondary | ICD-10-CM | POA: Diagnosis not present

## 2018-03-07 DIAGNOSIS — J069 Acute upper respiratory infection, unspecified: Secondary | ICD-10-CM | POA: Insufficient documentation

## 2018-03-07 NOTE — ED Triage Notes (Signed)
Pt states that since [place] she has had headache, fever, dry cough, fever, chills, stuffy nose

## 2018-03-08 ENCOUNTER — Emergency Department (HOSPITAL_COMMUNITY): Payer: 59

## 2018-03-08 MED ORDER — GUAIFENESIN ER 1200 MG PO TB12: 1.0000 | ORAL_TABLET | Freq: Two times a day (BID) | ORAL | 0 refills | Status: DC

## 2018-03-08 MED ORDER — ACETAMINOPHEN-CODEINE 120-12 MG/5ML PO SOLN: 10.0000 mL | ORAL | 0 refills | Status: DC | PRN

## 2018-03-08 MED ORDER — SODIUM CHLORIDE 0.9 % IV BOLUS
1000.0000 mL | Freq: Once | INTRAVENOUS | Status: AC
  Administered 2018-03-08: 1000 mL via INTRAVENOUS

## 2018-03-08 MED ORDER — IBUPROFEN 800 MG PO TABS: 800.0000 mg | ORAL_TABLET | Freq: Three times a day (TID) | ORAL | 0 refills | Status: DC | PRN

## 2018-03-08 MED ORDER — IBUPROFEN 800 MG PO TABS
800.0000 mg | ORAL_TABLET | Freq: Once | ORAL | Status: AC
  Administered 2018-03-08: 800 mg via ORAL
  Filled 2018-03-08: qty 1

## 2018-03-08 NOTE — ED Provider Notes (Signed)
[facility] EMERGENCY DEPARTMENT Provider Note   CSN: [id number] Arrival date & time: 03/07/18  2004     History   Chief Complaint Chief Complaint  Patient presents with  . Headache  . Influenza    HPI Frances [name] is a 27 y.o. female.  HPI Patient presents to the emergency department with cough body aches and fever that started Thursday.  Patient states that nothing seemed to make the condition better or worse.  Patient states she did take some over-the-counter cough and cold medications which did not help her sleep somewhat but did not relieve her symptoms dramatically.  Patient states that she has not had any nausea or vomiting or sore throat.  The patient denies chest pain, shortness of breath, headache,blurred vision, neck pain, weakness, numbness, dizziness, anorexia, edema, abdominal pain, nausea, vomiting, diarrhea, rash, back pain, dysuria, hematemesis, bloody stool, near syncope, or syncope. Past Medical History:  Diagnosis Date  . Complication of anesthesia    with csections, felt like she "couldnt swallow during the delivery of baby"  . Headache(784.0)   . History of pre-eclampsia in prior pregnancy, currently pregnant   . No pertinent past medical history    preeclampsia with 2010 pregnancy  . Obesity (BMI 35.0-39.9 without comorbidity)   . Pregnancy induced hypertension   . Shortness of breath    sob during the night at times  . Ventricular hypertrophy     Patient Active Problem List   Diagnosis Date Noted  . Obesity 01/23/2016  . Prediabetes 09/28/2014  . Hemorrhoids in pregnancy and puerperium, delivered 08/05/2013  . Pain aggravated by activities of daily living 07/22/2013  . FLUID OVERLOAD 02/12/2009  . ESSENTIAL HYPERTENSION, BENIGN 02/12/2009  . PERICARDIAL EFFUSION 02/12/2009  . VENTRICULAR HYPERTROPHY, LEFT 02/12/2009  . PRE-ECLAMP/ECLAMPSIA SUPERIMPOSED PRE-XST HTN PP 02/12/2009    Past Surgical History:  Procedure  Laterality Date  . CESAREAN SECTION    . CESAREAN SECTION N/A 07/14/2013   Procedure: REPEAT CESAREAN SECTION;  Surgeon: Brock Bad, MD;  Location: [facility];  Service: Obstetrics;  Laterality: N/A;     OB History    Gravida  4   Para  3   Term  3   Preterm  0   AB  1   Living  3     SAB  0   TAB  1   Ectopic      Multiple      Live Births  3            Home Medications    Prior to Admission medications   Medication Sig Start Date End Date Taking? Authorizing Provider  ferrous sulfate 325 (65 FE) MG tablet Take 1 tablet (325 mg total) by mouth 2 (two) times daily with a meal. 02/12/18   Brock Bad, MD  ibuprofen (ADVIL,MOTRIN) 600 MG tablet Take 1 tablet (600 mg total) by mouth every 6 (six) hours as needed. Patient not taking: Reported on 02/12/2018 11/15/17   Marny Lowenstein, PA-C  megestrol (MEGACE) 20 MG tablet Take 2 tablets (40 mg total) by mouth 2 (two) times daily. 11/15/17   Marny Lowenstein, PA-C  metFORMIN (GLUCOPHAGE XR) 500 MG 24 hr tablet Take 3 tablets (1,500 mg total) by mouth daily after supper. 02/12/18   Brock Bad, MD  norgestimate-ethinyl estradiol (ORTHO-CYCLEN,SPRINTEC,PREVIFEM) 0.25-35 MG-MCG tablet Take 1 tablet by mouth daily. 02/12/18   Brock Bad, MD  ondansetron (ZOFRAN) 4 MG tablet Take 1  tablet (4 mg total) by mouth every 6 (six) hours. Patient not taking: Reported on 02/12/2018 11/15/17   Marny Lowenstein, PA-C    Family History Family History  Problem Relation Age of Onset  . Stroke Maternal Uncle   . Diabetes Maternal Uncle   . Diabetes Paternal Grandmother   . Hypertension Mother     Social History Social History   Tobacco Use  . Smoking status: Current Every Day Smoker    Packs/day: 0.25    Years: 4.00    Pack years: 1.00    Types: Cigarettes    Last attempt to quit: 11/13/2012    Years since quitting: 5.3  . Smokeless tobacco: Never Used  Substance Use Topics  . Alcohol use: Yes    Alcohol/week: 0.0 oz     Comment: social  . Drug use: No    Comment: Former Marijuana use     Allergies   Lisinopril and Penicillins   Review of Systems Review of Systems All other systems negative except as documented in the HPI. All pertinent positives and negatives as reviewed in the HPI.  Physical Exam Updated Vital Signs BP 98/63 (BP Location: Left Arm)   Pulse 92   Temp (!) 100.8 F (38.2 C) (Oral)   Resp 18   LMP 03/08/2018   SpO2 98%   Physical Exam  Constitutional: She is oriented to person, place, and time. She appears well-developed and well-nourished. No distress.  HENT:  Head: Normocephalic and atraumatic.  Mouth/Throat: Oropharynx is clear and moist.  Eyes: Pupils are equal, round, and reactive to light.  Neck: Normal range of motion. Neck supple.  Cardiovascular: Normal rate, regular rhythm and normal heart sounds. Exam reveals no gallop and no friction rub.  No murmur heard. Pulmonary/Chest: Effort normal and breath sounds normal. No respiratory distress. She has no wheezes.  Neurological: She is alert and oriented to person, place, and time. She exhibits normal muscle tone. Coordination normal.  Skin: Skin is warm and dry. Capillary refill takes less than 2 seconds. No rash noted. No erythema.  Psychiatric: She has a normal mood and affect. Her behavior is normal.  Nursing note and vitals reviewed.    ED Treatments / Results  Labs (all labs ordered are listed, but only abnormal results are displayed) Labs Reviewed - No data to display  EKG None  Radiology Dg Chest 2 View  Result Date: 03/08/2018 CLINICAL DATA:  Cough, body aches, and chills since Friday. Fever and headache. High blood pressure. EXAM: CHEST - 2 VIEW COMPARISON:  09/24/2010 FINDINGS: The heart size and mediastinal contours are within normal limits. Both lungs are clear. The visualized skeletal structures are unremarkable. IMPRESSION: No active cardiopulmonary disease. Electronically Signed   By: Burman Nieves M.D.   On: 03/08/2018 05:44    Procedures Procedures (including critical care time)  Medications Ordered in ED Medications  sodium chloride 0.9 % bolus 1,000 mL (1,000 mLs Intravenous New Bag/Given 03/08/18 0500)  ibuprofen (ADVIL,MOTRIN) tablet 800 mg (800 mg Oral Given 03/08/18 0500)     Initial Impression / Assessment and Plan / ED Course  I have reviewed the triage vital signs and the nursing notes.  Pertinent labs & imaging results that were available during my care of the patient were reviewed by me and considered in my medical decision making (see chart for details).     Patient to be treated for viral URI with cough.  The patient has been stable here in the emergency department  I did give her IV fluids.  Told to rest and increase her fluid intake as much as possible.  Patient agrees to the plan and all questions were answered.  Final Clinical Impressions(s) / ED Diagnoses   Final diagnoses:  None    ED Discharge Orders    None       Charlestine Night, Cordelia Poche 03/08/18 0606    Dione Booze, MD 03/08/18 [phone]

## 2018-03-08 NOTE — Discharge Instructions (Signed)
Return here as needed.  Follow-up with your primary doctor.  Increase her fluid intake and rest as much as possible.

## 2019-01-13 ENCOUNTER — Emergency Department (HOSPITAL_COMMUNITY)
Admission: EM | Admit: 2019-01-13 | Discharge: 2019-01-13 | Disposition: A | Discharge status: Home / Self Care | Payer: 59 | Attending: Emergency Medicine | Admitting: Emergency Medicine

## 2019-01-13 ENCOUNTER — Encounter (HOSPITAL_COMMUNITY): Payer: Self-pay | Admitting: Emergency Medicine

## 2019-01-13 ENCOUNTER — Other Ambulatory Visit: Payer: Self-pay

## 2019-01-13 DIAGNOSIS — R358 Other polyuria: Secondary | ICD-10-CM | POA: Diagnosis not present

## 2019-01-13 DIAGNOSIS — F17210 Nicotine dependence, cigarettes, uncomplicated: Secondary | ICD-10-CM | POA: Insufficient documentation

## 2019-01-13 DIAGNOSIS — R200 Anesthesia of skin: Secondary | ICD-10-CM | POA: Insufficient documentation

## 2019-01-13 DIAGNOSIS — R631 Polydipsia: Secondary | ICD-10-CM | POA: Insufficient documentation

## 2019-01-13 DIAGNOSIS — Z79899 Other long term (current) drug therapy: Secondary | ICD-10-CM | POA: Insufficient documentation

## 2019-01-13 DIAGNOSIS — E119 Type 2 diabetes mellitus without complications: Secondary | ICD-10-CM | POA: Diagnosis not present

## 2019-01-13 DIAGNOSIS — R739 Hyperglycemia, unspecified: Secondary | ICD-10-CM | POA: Diagnosis present

## 2019-01-13 DIAGNOSIS — N3000 Acute cystitis without hematuria: Secondary | ICD-10-CM | POA: Diagnosis not present

## 2019-01-13 DIAGNOSIS — R51 Headache: Secondary | ICD-10-CM | POA: Diagnosis not present

## 2019-01-13 LAB — CBC
HCT: 39.1 % (ref 36.0–46.0)
Hemoglobin: 12.6 g/dL (ref 12.0–15.0)
MCH: 27.9 pg (ref 26.0–34.0)
MCHC: 32.2 g/dL (ref 30.0–36.0)
MCV: 86.5 fL (ref 80.0–100.0)
Platelets: 293 10*3/uL (ref 150–400)
RBC: 4.52 MIL/uL (ref 3.87–5.11)
RDW: 12.9 % (ref 11.5–15.5)
WBC: 9.6 10*3/uL (ref 4.0–10.5)
nRBC: 0 % (ref 0.0–0.2)

## 2019-01-13 LAB — I-STAT BETA HCG BLOOD, ED (MC, WL, AP ONLY): I-stat hCG, quantitative: <5 m[IU]/mL (ref <5)

## 2019-01-13 LAB — BASIC METABOLIC PANEL
Anion gap: 10 (ref 5–15)
BUN: 8 mg/dL (ref 6–20)
CO2: 21 mmol/L — ABNORMAL LOW (ref 22–32)
Calcium: 9.2 mg/dL (ref 8.9–10.3)
Chloride: 103 mmol/L (ref 98–111)
Creatinine, Ser: 0.83 mg/dL (ref 0.44–1.00)
GFR calc Af Amer: >60 mL/min (ref >60)
GFR calc non Af Amer: >60 mL/min (ref >60)
Glucose, Bld: 330 mg/dL — ABNORMAL HIGH (ref 70–99)
POTASSIUM: 3.9 mmol/L (ref 3.5–5.1)
Sodium: 134 mmol/L — ABNORMAL LOW (ref 135–145)

## 2019-01-13 LAB — HEMOGLOBIN A1C
Hgb A1c MFr Bld: 11.4 % — ABNORMAL HIGH (ref 4.8–5.6)
Mean Plasma Glucose: 280.48 mg/dL

## 2019-01-13 LAB — URINALYSIS, ROUTINE W REFLEX MICROSCOPIC
Bilirubin Urine: NEGATIVE
Glucose, UA: >=500 mg/dL — AB
Hgb urine dipstick: NEGATIVE
Ketones, ur: NEGATIVE mg/dL
Leukocytes,Ua: NEGATIVE
Nitrite: POSITIVE — AB
Protein, ur: NEGATIVE mg/dL
Specific Gravity, Urine: 1.028 (ref 1.005–1.030)
pH: 6 (ref 5.0–8.0)

## 2019-01-13 LAB — CBG MONITORING, ED
GLUCOSE-CAPILLARY: 349 mg/dL — AB (ref 70–99)
Glucose-Capillary: 162 mg/dL — ABNORMAL HIGH (ref 70–99)

## 2019-01-13 MED ORDER — PROCHLORPERAZINE EDISYLATE 10 MG/2ML IJ SOLN: 10.0000 mg | Freq: Once | INTRAMUSCULAR | Status: DC

## 2019-01-13 MED ORDER — ONDANSETRON 4 MG PO TBDP: 4.0000 mg | ORAL_TABLET | Freq: Three times a day (TID) | ORAL | 0 refills | Status: DC | PRN

## 2019-01-13 MED ORDER — DIPHENHYDRAMINE HCL 50 MG/ML IJ SOLN: 25.0000 mg | Freq: Once | INTRAMUSCULAR | Status: DC

## 2019-01-13 MED ORDER — CEPHALEXIN 500 MG PO CAPS: 500.0000 mg | ORAL_CAPSULE | Freq: Three times a day (TID) | ORAL | 0 refills | Status: DC

## 2019-01-13 MED ORDER — METFORMIN HCL 1000 MG PO TABS: 1000.0000 mg | ORAL_TABLET | Freq: Two times a day (BID) | ORAL | 0 refills | Status: DC

## 2019-01-13 MED ORDER — ONDANSETRON 4 MG PO TBDP
4.0000 mg | ORAL_TABLET | Freq: Once | ORAL | Status: AC
  Administered 2019-01-13: 4 mg via ORAL
  Filled 2019-01-13: qty 1

## 2019-01-13 MED ORDER — SODIUM CHLORIDE 0.9% FLUSH: 3.0000 mL | Freq: Once | INTRAVENOUS | Status: DC

## 2019-01-13 MED ORDER — SODIUM CHLORIDE 0.9 % IV BOLUS
1000.0000 mL | Freq: Once | INTRAVENOUS | Status: AC
  Administered 2019-01-13: 1000 mL via INTRAVENOUS

## 2019-01-13 MED ORDER — METFORMIN HCL 500 MG PO TABS
1000.0000 mg | ORAL_TABLET | Freq: Once | ORAL | Status: AC
  Administered 2019-01-13: 1000 mg via ORAL
  Filled 2019-01-13: qty 2

## 2019-01-13 NOTE — Discharge Instructions (Signed)
You were seen in the ER for headache, tingling, increased thirst and increased urination.  Your A1c is 11.  This suggests you have type 2 diabetes mellitus.  Start taking metformin as prescribed.  Use ondansetron for nausea as needed.  You were found to have a urinary tract infection, take antibiotic as prescribed.  You need to establish care with a primary care doctor soon as possible for further management of your diabetes.  Return to the ER for nausea, vomiting, abdominal pain, persistently elevated glucose levels greater than 400-500.

## 2019-01-13 NOTE — ED Provider Notes (Signed)
[facility] Provider Note   CSN: [id number] Arrival date & time: 01/13/19  1008    History   Chief Complaint Chief Complaint  Patient presents with  . Numbness  . Headache    HPI Frances [name] is a 28 y.o. female with previous history of diabetes, headaches, obesity is here for concern of elevated sugar levels.  Today she was at work and developed a mild, right-sided headache behind her eye, stabbing and numbness to her right thumb and right finger radiating up to her right shoulder and upper arm.  Her coworkers checked her CBG in a row is 38383.  Upon arrival CBG is 349.  States 2 to 3 years ago her OB/GYN told her she was prediabetic and took metformin for a while but stopped due to nausea.  She has noticed morning headaches for the last several months as well as tingling, pins and needle sensation in her feet, polyuria, increased thirst.  Over the last last month she has noticed decreased sensation to her right thumb and right index that is intermittent.  Has history of headaches but headaches for the last several months have felt differently.  Denies any head trauma, anticoagulants, vision changes or loss, nausea, vomiting, photophobia, neck stiffness or pain, fevers.  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  Noncompliant with antihypertensives since 2016 due to adverse effects of facial swelling.  She has no chest pain or shortness of breath.    HPI  Past Medical History:  Diagnosis Date  . Complication of anesthesia    with csections, felt like she "couldnt swallow during the delivery of baby"  . Headache(784.0)   . History of pre-eclampsia in prior pregnancy, currently pregnant   . No pertinent past medical history    preeclampsia with 2010 pregnancy  . Obesity (BMI 35.0-39.9 without comorbidity)   . Pregnancy induced hypertension   . Shortness of breath    sob during the night at times  . Ventricular hypertrophy      Patient Active Problem List   Diagnosis Date Noted  . Obesity 01/23/2016  . Prediabetes 09/28/2014  . Hemorrhoids in pregnancy and puerperium, delivered 08/05/2013  . Pain aggravated by activities of daily living 07/22/2013  . FLUID OVERLOAD 02/12/2009  . ESSENTIAL HYPERTENSION, BENIGN 02/12/2009  . PERICARDIAL EFFUSION 02/12/2009  . VENTRICULAR HYPERTROPHY, LEFT 02/12/2009  . PRE-ECLAMP/ECLAMPSIA SUPERIMPOSED PRE-XST HTN PP 02/12/2009    Past Surgical History:  Procedure Laterality Date  . CESAREAN SECTION    . CESAREAN SECTION N/A 07/14/2013   Procedure: REPEAT CESAREAN SECTION;  Surgeon: Brock Badharles A Harper, MD;  Location: [facility];  Service: Obstetrics;  Laterality: N/A;     OB History    Gravida  4   Para  3   Term  3   Preterm  0   AB  1   Living  3     SAB  0   TAB  1   Ectopic      Multiple      Live Births  3            Home Medications    Prior to Admission medications   Medication Sig Start Date End Date Taking? Authorizing Provider  acetaminophen-codeine 120-12 MG/5ML solution Take 10 mLs by mouth every 4 (four) hours as needed for moderate pain. 03/08/18   Lawyer, Cristal Deerhristopher, PA-C  cephALEXin (KEFLEX) 500 MG capsule Take 1 capsule (500 mg total) by mouth 3 (three)  times daily. 01/13/19   Liberty Handy, PA-C  ferrous sulfate 325 (65 FE) MG tablet Take 1 tablet (325 mg total) by mouth 2 (two) times daily with a meal. 02/12/18   Brock Bad, MD  Guaifenesin 1200 MG TB12 Take 1 tablet (1,200 mg total) by mouth 2 (two) times daily. 03/08/18   Lawyer, Cristal Deer, PA-C  ibuprofen (ADVIL,MOTRIN) 800 MG tablet Take 1 tablet (800 mg total) by mouth every 8 (eight) hours as needed. 03/08/18   Lawyer, Cristal Deer, PA-C  megestrol (MEGACE) 20 MG tablet Take 2 tablets (40 mg total) by mouth 2 (two) times daily. 11/15/17   Marny Lowenstein, PA-C  metFORMIN (GLUCOPHAGE) 1000 MG tablet Take 1 tablet (1,000 mg total) by mouth 2 (two) times daily. 01/13/19    Liberty Handy, PA-C  norgestimate-ethinyl estradiol (ORTHO-CYCLEN,SPRINTEC,PREVIFEM) 0.25-35 MG-MCG tablet Take 1 tablet by mouth daily. 02/12/18   Brock Bad, MD  ondansetron (ZOFRAN ODT) 4 MG disintegrating tablet Take 1 tablet (4 mg total) by mouth every 8 (eight) hours as needed for nausea or vomiting. 01/13/19   Liberty Handy, PA-C  ondansetron (ZOFRAN) 4 MG tablet Take 1 tablet (4 mg total) by mouth every 6 (six) hours. Patient not taking: Reported on 02/12/2018 11/15/17   Marny Lowenstein, PA-C    Family History Family History  Problem Relation Age of Onset  . Stroke Maternal Uncle   . Diabetes Maternal Uncle   . Diabetes Paternal Grandmother   . Hypertension Mother     Social History Social History   Tobacco Use  . Smoking status: Current Every Day Smoker    Packs/day: 0.25    Years: 4.00    Pack years: 1.00    Types: Cigarettes    Last attempt to quit: 11/13/2012    Years since quitting: 6.1  . Smokeless tobacco: Never Used  Substance Use Topics  . Alcohol use: Yes    Alcohol/week: 0.0 standard drinks    Comment: social  . Drug use: No    Comment: Former Marijuana use     Allergies   Lisinopril and Penicillins   Review of Systems Review of Systems  Endocrine: Positive for polydipsia and polyuria.  Genitourinary: Positive for frequency.  Neurological: Positive for headaches.       Paresthesias   All other systems reviewed and are negative.    Physical Exam Updated Vital Signs BP 114/83 (BP Location: Left Arm)   Pulse 73   Temp 98.8 F (37.1 C) (Oral)   Resp 16   Ht 5\' 10"  (1.778 m)   Wt (!) 174.2 kg   SpO2 100%   BMI 55.10 kg/m   Physical Exam Vitals signs and nursing note reviewed.  Constitutional:      Appearance: She is well-developed.     Comments: Non toxic  HENT:     Head: Normocephalic and atraumatic.     Nose: Nose normal.  Eyes:     Conjunctiva/sclera: Conjunctivae normal.     Pupils: Pupils are equal, round, and  reactive to light.  Neck:     Musculoskeletal: Normal range of motion.  Cardiovascular:     Rate and Rhythm: Normal rate and regular rhythm.  Pulmonary:     Effort: Pulmonary effort is normal.     Breath sounds: Normal breath sounds.  Abdominal:     General: Bowel sounds are normal.     Palpations: Abdomen is soft.     Tenderness: There is no abdominal tenderness.  Comments: No G/R/R. No suprapubic or CVA tenderness. Negative Murphy's and McBurney's. Active BS to lower quadrants.   Musculoskeletal: Normal range of motion.  Skin:    General: Skin is warm and dry.     Capillary Refill: Capillary refill takes less than 2 seconds.  Neurological:     Mental Status: She is alert and oriented to person, place, and time.     Comments:  Alert and oriented to self, place, time and event.  Speech is fluent without dysarthria or dysphasia. Strength 5/5 with hand grip and ankle F/E.   Sensation to light touch/pinch intact in face, hands and feet (including right thumb) Normal gait No pronator drift. No leg drop. Normal finger-to-nose and feet tapping.  CN I not tested CN II grossly intact visual fields bilaterally. Unable to visualize posterior eye. CN III, IV, VI PEERL and EOMs intact bilaterally CN V light touch intact in all 3 divisions of trigeminal nerve CN VII facial movements symmetric CN VIII not tested CN IX, X no uvula deviation, symmetric rise of soft palate  CN XI 5/5 SCM and trapezius strength bilaterally  CN XII Midline tongue protrusion, symmetric L/R movements  Psychiatric:        Behavior: Behavior normal.      ED Treatments / Results  Labs (all labs ordered are listed, but only abnormal results are displayed) Labs Reviewed  BASIC METABOLIC PANEL - Abnormal; Notable for the following components:      Result Value   Sodium 134 (*)    CO2 21 (*)    Glucose, Bld 330 (*)    All other components within normal limits  URINALYSIS, ROUTINE W REFLEX MICROSCOPIC -  Abnormal; Notable for the following components:   APPearance HAZY (*)    Glucose, UA >=500 (*)    Nitrite POSITIVE (*)    Bacteria, UA MANY (*)    All other components within normal limits  HEMOGLOBIN A1C - Abnormal; Notable for the following components:   Hgb A1c MFr Bld 11.4 (*)    All other components within normal limits  CBG MONITORING, ED - Abnormal; Notable for the following components:   Glucose-Capillary 349 (*)    All other components within normal limits  CBG MONITORING, ED - Abnormal; Notable for the following components:   Glucose-Capillary 162 (*)    All other components within normal limits  URINE CULTURE  CBC  I-STAT BETA HCG BLOOD, ED ([facility], [facility], [facility] ONLY)  CBG MONITORING, ED    EKG None   Radiology No results found.  Procedures Procedures (including critical care time)  Medications Ordered in ED Medications  sodium chloride flush (NS) 0.9 % injection 3 mL (has no administration in time range)  sodium chloride 0.9 % bolus 1,000 mL (1,000 mLs Intravenous New Bag/Given 01/13/19 1357)  metFORMIN (GLUCOPHAGE) tablet 1,000 mg (1,000 mg Oral Given 01/13/19 1425)  ondansetron (ZOFRAN-ODT) disintegrating tablet 4 mg (4 mg Oral Given 01/13/19 1425)     Initial Impression / Assessment and Plan / ED Course  I have reviewed the triage vital signs and the nursing notes.  Pertinent labs & imaging results that were available during my care of the patient were reviewed by me and considered in my medical decision making (see chart for details).  Clinical Course as of Jan 13 1547  Wed Jan 13, 2019  1231 CO2(!): 21 [JM]  1240 Glucose(!): 330 [CG]  1421 Hemoglobin A1C(!): 11.4 [CG]  1424 Nitrite(!): POSITIVE [CG]  1424 Bacteria, UA(!): MANY [  CG]    Clinical Course User Index [CG] Liberty Handy, PA-C [JM] [place]       Pt is a 28 y.o. female who presents with hyperglycemia.  Reports hyperglycemia symptoms including headaches, paresthesias for  months.  Likely related to elevated CBG and peripheral neuropathy. Discontinued metformin due to nausea. I doubt acute intracranial etiology such as CVA/TIA, ICH, meningitis given presentation.  Initial CBG 383. Potassium normal.  No gap. UA with glucosuria, +nitrites, many bacteria. Patient given IVF, metformin in ED.  No signs of DKA or HHS. ED lab work consistent with uncomplicated hyperglycemia.  Repeat CBG improving.  A1c is 11.4.  Discussed diagnosis of T2DM with patient.  She was encouraged to f/u with PCP for long term management of DM.  Dc with  Metformin, zofran, keflex for UTI.  Return precautions given. Pt comfortable with this.   Final Clinical Impressions(s) / ED Diagnoses   Final diagnoses:  Type 2 diabetes mellitus without complication, without long-term current use of insulin (HCC)  Acute cystitis without hematuria    ED Discharge Orders         Ordered    metFORMIN (GLUCOPHAGE) 1000 MG tablet  2 times daily     01/13/19 1534    ondansetron (ZOFRAN ODT) 4 MG disintegrating tablet  Every 8 hours PRN     01/13/19 1534    cephALEXin (KEFLEX) 500 MG capsule  3 times daily     01/13/19 1534           Liberty Handy, [place] 01/13/19 1548    Tegeler, Canary Brim, MD 01/13/19 [id number]

## 2019-01-13 NOTE — ED Notes (Signed)
CBG 162 

## 2019-01-13 NOTE — ED Triage Notes (Addendum)
Patient reports numbness between her index and thumb on the R hand, this am she woke up at 3am with numbness to her R shoulder. She also endorses tingling to bilateral feet that is ongoing. She endorses headache on the R side as well upon waking. Patient a/ox4, resp e/u, nad. Speech clear, face symmetrical, no neuro deficits.

## 2019-01-13 NOTE — ED Notes (Signed)
Patient verbalizes understanding of discharge instructions. Opportunity for questioning and answers were provided. Armband removed by staff, pt discharged from ED.  

## 2019-01-13 NOTE — ED Notes (Signed)
Pt CBG was 349, notified Chelsea(RN)

## 2019-01-14 LAB — URINE CULTURE

## 2019-01-27 ENCOUNTER — Encounter (HOSPITAL_COMMUNITY): Payer: Self-pay

## 2019-01-27 ENCOUNTER — Emergency Department (HOSPITAL_COMMUNITY)
Admission: EM | Admit: 2019-01-27 | Discharge: 2019-01-27 | Disposition: A | Discharge status: Home / Self Care | Payer: 59 | Attending: Emergency Medicine | Admitting: Emergency Medicine

## 2019-01-27 ENCOUNTER — Telehealth: Payer: Self-pay

## 2019-01-27 ENCOUNTER — Other Ambulatory Visit: Payer: Self-pay | Admitting: Family Medicine

## 2019-01-27 ENCOUNTER — Other Ambulatory Visit: Payer: Self-pay

## 2019-01-27 DIAGNOSIS — F17210 Nicotine dependence, cigarettes, uncomplicated: Secondary | ICD-10-CM | POA: Insufficient documentation

## 2019-01-27 DIAGNOSIS — R000 Tachycardia, unspecified: Secondary | ICD-10-CM | POA: Diagnosis not present

## 2019-01-27 DIAGNOSIS — D649 Anemia, unspecified: Secondary | ICD-10-CM

## 2019-01-27 DIAGNOSIS — N938 Other specified abnormal uterine and vaginal bleeding: Secondary | ICD-10-CM

## 2019-01-27 DIAGNOSIS — E119 Type 2 diabetes mellitus without complications: Secondary | ICD-10-CM | POA: Diagnosis not present

## 2019-01-27 DIAGNOSIS — R030 Elevated blood-pressure reading, without diagnosis of hypertension: Secondary | ICD-10-CM | POA: Insufficient documentation

## 2019-01-27 DIAGNOSIS — Z79899 Other long term (current) drug therapy: Secondary | ICD-10-CM | POA: Insufficient documentation

## 2019-01-27 DIAGNOSIS — N921 Excessive and frequent menstruation with irregular cycle: Secondary | ICD-10-CM

## 2019-01-27 DIAGNOSIS — Z7984 Long term (current) use of oral hypoglycemic drugs: Secondary | ICD-10-CM | POA: Diagnosis not present

## 2019-01-27 LAB — CBC
HCT: 32.2 % — ABNORMAL LOW (ref 36.0–46.0)
Hemoglobin: 10.6 g/dL — ABNORMAL LOW (ref 12.0–15.0)
MCH: 28.6 pg (ref 26.0–34.0)
MCHC: 32.9 g/dL (ref 30.0–36.0)
MCV: 87 fL (ref 80.0–100.0)
Platelets: 289 10*3/uL (ref 150–400)
RBC: 3.7 MIL/uL — ABNORMAL LOW (ref 3.87–5.11)
RDW: 12.6 % (ref 11.5–15.5)
WBC: 9.2 10*3/uL (ref 4.0–10.5)
nRBC: 0 % (ref 0.0–0.2)

## 2019-01-27 LAB — BASIC METABOLIC PANEL
Anion gap: 5 (ref 5–15)
BUN: 9 mg/dL (ref 6–20)
CO2: 26 mmol/L (ref 22–32)
Calcium: 9 mg/dL (ref 8.9–10.3)
Chloride: 106 mmol/L (ref 98–111)
Creatinine, Ser: 0.82 mg/dL (ref 0.44–1.00)
GFR calc Af Amer: >60 mL/min (ref >60)
GFR calc non Af Amer: >60 mL/min (ref >60)
Glucose, Bld: 281 mg/dL — ABNORMAL HIGH (ref 70–99)
Potassium: 3.5 mmol/L (ref 3.5–5.1)
Sodium: 137 mmol/L (ref 135–145)

## 2019-01-27 LAB — I-STAT BETA HCG BLOOD, ED (MC, WL, AP ONLY): I-stat hCG, quantitative: <5 m[IU]/mL (ref <5)

## 2019-01-27 MED ORDER — MEGESTROL ACETATE 40 MG PO TABS: ORAL_TABLET | ORAL | 3 refills | Status: DC

## 2019-01-27 NOTE — ED Provider Notes (Signed)
[facility] EMERGENCY DEPARTMENT Provider Note   CSN: [id number] Arrival date & time: 01/27/19  1301    History   Chief Complaint Chief Complaint  Patient presents with  . Vaginal Bleeding    HPI Frances [name] is a 28 y.o. female who presents with vaginal bleeding.  Past medical history significant for PCOS, diabetes, obesity, hypertension.  The patient states that she was here about 3 weeks ago and diagnosed with type 2 diabetes.  She was given a prescription for 1000mg  of metformin twice daily.  Since she has been taking this she has been having heavy vaginal bleeding.  She has had irregular periods in the past but has never had heavy bleeding like this before.  She is passing multiple clots.  She is going through a heavy flow pad at least once an hour.  She called her OB/GYN today and he was going to put her on scheduled birth control however when she described the amount of bleeding she was having he recommended her to come to the ED to have her blood counts checked due to a prior history of anemia.  She denies any lightheadedness or dizziness.  She just feels generally fatigued.  She reports some abdominal cramping when she is passing the clots but this is intermittent.  She is not currently sexually active and does not have concerns for pregnancy and denies any other vaginal discharge and does not have concern for STD. She was previously on Megace and OCPs but hasn't had this in about a year.  OBGYN: Dr. Coral Ceo    HPI  Past Medical History:  Diagnosis Date  . Complication of anesthesia    with csections, felt like she "couldnt swallow during the delivery of baby"  . Headache(784.0)   . History of pre-eclampsia in prior pregnancy, currently pregnant   . No pertinent past medical history    preeclampsia with 2010 pregnancy  . Obesity (BMI 35.0-39.9 without comorbidity)   . Pregnancy induced hypertension   . Shortness of breath    sob during the  night at times  . Ventricular hypertrophy     Patient Active Problem List   Diagnosis Date Noted  . Obesity 01/23/2016  . Prediabetes 09/28/2014  . Hemorrhoids in pregnancy and puerperium, delivered 08/05/2013  . Pain aggravated by activities of daily living 07/22/2013  . FLUID OVERLOAD 02/12/2009  . ESSENTIAL HYPERTENSION, BENIGN 02/12/2009  . PERICARDIAL EFFUSION 02/12/2009  . VENTRICULAR HYPERTROPHY, LEFT 02/12/2009  . PRE-ECLAMP/ECLAMPSIA SUPERIMPOSED PRE-XST HTN PP 02/12/2009    Past Surgical History:  Procedure Laterality Date  . CESAREAN SECTION    . CESAREAN SECTION N/A 07/14/2013   Procedure: REPEAT CESAREAN SECTION;  Surgeon: Brock Bad, MD;  Location: [facility];  Service: Obstetrics;  Laterality: N/A;     OB History    Gravida  4   Para  3   Term  3   Preterm  0   AB  1   Living  3     SAB  0   TAB  1   Ectopic      Multiple      Live Births  3            Home Medications    Prior to Admission medications   Medication Sig Start Date End Date Taking? Authorizing Provider  acetaminophen-codeine 120-12 MG/5ML solution Take 10 mLs by mouth every 4 (four) hours as needed for moderate pain. 03/08/18   Charlestine Night,  PA-C  cephALEXin (KEFLEX) 500 MG capsule Take 1 capsule (500 mg total) by mouth 3 (three) times daily. 01/13/19   Liberty Handy, PA-C  ferrous sulfate 325 (65 FE) MG tablet Take 1 tablet (325 mg total) by mouth 2 (two) times daily with a meal. 02/12/18   Brock Bad, MD  Guaifenesin 1200 MG TB12 Take 1 tablet (1,200 mg total) by mouth 2 (two) times daily. 03/08/18   Lawyer, Cristal Deer, PA-C  ibuprofen (ADVIL,MOTRIN) 800 MG tablet Take 1 tablet (800 mg total) by mouth every 8 (eight) hours as needed. 03/08/18   Lawyer, Cristal Deer, PA-C  megestrol (MEGACE) 20 MG tablet Take 2 tablets (40 mg total) by mouth 2 (two) times daily. 11/15/17   Marny Lowenstein, PA-C  metFORMIN (GLUCOPHAGE) 1000 MG tablet Take 1 tablet (1,000 mg total)  by mouth 2 (two) times daily. 01/13/19   Liberty Handy, PA-C  norgestimate-ethinyl estradiol (ORTHO-CYCLEN,SPRINTEC,PREVIFEM) 0.25-35 MG-MCG tablet Take 1 tablet by mouth daily. 02/12/18   Brock Bad, MD  ondansetron (ZOFRAN ODT) 4 MG disintegrating tablet Take 1 tablet (4 mg total) by mouth every 8 (eight) hours as needed for nausea or vomiting. 01/13/19   Liberty Handy, PA-C  ondansetron (ZOFRAN) 4 MG tablet Take 1 tablet (4 mg total) by mouth every 6 (six) hours. Patient not taking: Reported on 02/12/2018 11/15/17   Marny Lowenstein, PA-C    Family History Family History  Problem Relation Age of Onset  . Stroke Maternal Uncle   . Diabetes Maternal Uncle   . Diabetes Paternal Grandmother   . Hypertension Mother     Social History Social History   Tobacco Use  . Smoking status: Current Every Day Smoker    Packs/day: 0.25    Years: 4.00    Pack years: 1.00    Types: Cigarettes    Last attempt to quit: 11/13/2012    Years since quitting: 6.2  . Smokeless tobacco: Never Used  Substance Use Topics  . Alcohol use: Yes    Alcohol/week: 0.0 standard drinks    Comment: social  . Drug use: No    Comment: Former Marijuana use     Allergies   Lisinopril and Penicillins   Review of Systems Review of Systems  Constitutional: Positive for fatigue.  Respiratory: Negative for shortness of breath.   Genitourinary: Positive for pelvic pain (cramping) and vaginal bleeding. Negative for vaginal discharge.  Neurological: Negative for syncope and light-headedness.  Hematological: Does not bruise/bleed easily.  All other systems reviewed and are negative.    Physical Exam Updated Vital Signs BP (!) 177/89 (BP Location: Right Arm)   Pulse (!) 110   Temp 99.9 F (37.7 C) (Oral)   Resp 18   LMP 12/29/2018   SpO2 98%   Physical Exam Vitals signs and nursing note reviewed.  Constitutional:      General: She is not in acute distress.    Appearance: She is well-developed. She  is obese. She is not ill-appearing.     Comments: Calm and cooperative  HENT:     Head: Normocephalic and atraumatic.  Eyes:     General: No scleral icterus.       Right eye: No discharge.        Left eye: No discharge.     Conjunctiva/sclera: Conjunctivae normal.     Pupils: Pupils are equal, round, and reactive to light.  Neck:     Musculoskeletal: Normal range of motion.  Cardiovascular:  Rate and Rhythm: Normal rate and regular rhythm.  Pulmonary:     Effort: Pulmonary effort is normal. No respiratory distress.     Breath sounds: Normal breath sounds.  Abdominal:     General: There is no distension.     Palpations: Abdomen is soft.     Tenderness: There is no abdominal tenderness.  Genitourinary:    Comments: Pelvic: No inguinal lymphadenopathy or inguinal hernia noted. Normal external genitalia. No pain with speculum insertion. Closed cervical os with normal appearance - no rash or lesions. Moderate amount of blood in vaginal vault. No large clots or hemorrhage. Chaperone present during exam.  Skin:    General: Skin is warm and dry.  Neurological:     Mental Status: She is alert and oriented to person, place, and time.  Psychiatric:        Behavior: Behavior normal.      ED Treatments / Results  Labs (all labs ordered are listed, but only abnormal results are displayed) Labs Reviewed  CBC - Abnormal; Notable for the following components:      Result Value   RBC 3.70 (*)    Hemoglobin 10.6 (*)    HCT 32.2 (*)    All other components within normal limits  BASIC METABOLIC PANEL  I-STAT BETA HCG BLOOD, ED ([facility], [facility], [facility] ONLY)    EKG None  Radiology No results found.  Procedures Procedures (including critical care time)  Medications Ordered in ED Medications - No data to display   Initial Impression / Assessment and Plan / ED Course  I have reviewed the triage vital signs and the nursing notes.  Pertinent labs & imaging results that were available during  my care of the patient were reviewed by me and considered in my medical decision making (see chart for details).  28 year old female presents with heavy vaginal bleeding for the past 3 weeks.  She is mildly tachycardic and hypertensive here.  Abdomen is soft and nontender.  Pelvic exam is remarkable for a moderate amount of vaginal bleeding without significant clots or hemorrhage.  She is not having significant pain therefore do not think she needs a pelvic ultrasound today.  Pregnancy test is negative.  Hemoglobin has dropped from 12.6 several weeks ago to 10.6.  Discussed with Dr. Alvester Morin at [facility].  She recommends starting the patient on Megace and to follow-up in the office as an outpatient. Discussed with pt. She is comfortable with plan.   Final Clinical Impressions(s) / ED Diagnoses   Final diagnoses:  Dysfunctional uterine bleeding  Anemia, unspecified type    ED Discharge Orders    None       Bethel Born, PA-C 01/27/19 1510    Derwood Kaplan, MD 01/27/19 1954

## 2019-01-27 NOTE — ED Notes (Signed)
Help PA with the pelvic exam patient is now resting with call bell in reach

## 2019-01-27 NOTE — ED Notes (Signed)
Patient verbalizes understanding of discharge instructions. Opportunity for questioning and answers were provided. Armband removed by staff, pt discharged from ED ambulatory by self\  

## 2019-01-27 NOTE — Discharge Instructions (Signed)
Please start Megace for heavy bleeding Follow up with Dr. Clearance Coots Return if you start to become very lightheaded or severe pain

## 2019-01-27 NOTE — ED Triage Notes (Signed)
Pt presents with 3 week h/o heavy vaginal bleeding.  Pt reports she has intermittent abdominal cramping.  Pt reports she is newly diagnosed diabetic and began metformin 1000mg  BID x 3 weeks; reports h/o PCOS where she has vaginal bleeding but not this heavy.  Pt reports changing pads every hour; pt went to her OB/GYN today and referred here.

## 2019-01-27 NOTE — Progress Notes (Signed)
.       OB/GYN Telephone Consult   Frances [name] is a 28 y.o. [phone] currently not pregnant presenting to [facility]. I was called for a consult regarding the care of this patient by [facility].    The provider had a clinical question about Vaginal bleeding managment  The provider presented the following relevant clinical information: HGB 10.8 Bleeding on pelvic exam  I performed a chart review on the patient and reviewed available documentation.  LMP 12/29/2018   Exam- performed by consulting provider   Recommendations:  -Start megace taper  -Recommended APP provide the patient with a referral to the [facility] for [facility] (any office) for follow up in  4 weeks.   Thank you for this consult and if additional recommendations are needed please call [phone] for the OB/GYN attending on service at [facility].   I spent approximately 5 minutes directly consulting with the provider and verbally discussing this case. Additionally 5 minutes minutes was spent performing chart review and documentation.    Federico Flake, MD   Criteria for phone consult billing? (If answer to any of these are yes then you cannot bill this telephone consult) Will the patient be seen urgently (within 24hrs) at a [facility] practice? No Is this a patient on which I performed surgery within the last 7d? No Have you billed a telephone consult on this patient in the last 7d? No  CPT Code (based on total time, direct and indirect) 99441= 5-10 min

## 2019-01-27 NOTE — Telephone Encounter (Signed)
Returned call and pt stated that she has hx of PCOS, pt was in ED on 01-13-19 and was prescribed metformin. Pt stated that she has been bleeding for 3 weeks since starting it. Pt states that she is now bleeding through a pad an hour with clots that are getting bigger. Advised pt to be evaluated at hospital.

## 2019-02-24 ENCOUNTER — Other Ambulatory Visit: Payer: Self-pay | Admitting: Obstetrics

## 2019-02-24 DIAGNOSIS — B373 Candidiasis of vulva and vagina: Secondary | ICD-10-CM

## 2019-02-24 DIAGNOSIS — B3731 Acute candidiasis of vulva and vagina: Secondary | ICD-10-CM

## 2019-02-24 MED ORDER — FLUCONAZOLE 200 MG PO TABS: 200.0000 mg | ORAL_TABLET | ORAL | 2 refills | Status: DC

## 2019-02-25 NOTE — Progress Notes (Signed)
Patient notified of RX

## 2019-10-06 ENCOUNTER — Other Ambulatory Visit: Payer: Self-pay | Admitting: *Deleted

## 2019-10-06 DIAGNOSIS — B3731 Acute candidiasis of vulva and vagina: Secondary | ICD-10-CM

## 2019-10-06 DIAGNOSIS — B373 Candidiasis of vulva and vagina: Secondary | ICD-10-CM

## 2019-10-06 MED ORDER — FLUCONAZOLE 150 MG PO TABS: 150.0000 mg | ORAL_TABLET | Freq: Once | ORAL | 0 refills | Status: AC

## 2019-10-06 NOTE — Progress Notes (Signed)
Pt called to office stating symptoms of yeast infection, pt states she is also diabetic - request tx. Diflucan sent in per protocol.

## 2019-10-18 ENCOUNTER — Other Ambulatory Visit: Payer: Self-pay

## 2019-10-18 MED ORDER — TERCONAZOLE 0.8 % VA CREA: 1.0000 | TOPICAL_CREAM | Freq: Every day | VAGINAL | 0 refills | Status: DC

## 2019-10-18 NOTE — Progress Notes (Signed)
Pt called and reports that she is experiencing yeast infection symptoms, pt verbalizes that she has taken diflucan in the past and it hasn't completely cleared it up. I advised pt I will send her Terazol cream per protocol. Advised pt to call back if symptoms do not improve, pt verbalizes understanding.

## 2019-10-20 ENCOUNTER — Ambulatory Visit: Payer: 59 | Admitting: Obstetrics

## 2020-03-15 ENCOUNTER — Other Ambulatory Visit: Payer: Self-pay | Admitting: Obstetrics and Gynecology

## 2020-03-15 DIAGNOSIS — N920 Excessive and frequent menstruation with regular cycle: Secondary | ICD-10-CM

## 2020-03-24 ENCOUNTER — Ambulatory Visit
Admission: RE | Admit: 2020-03-24 | Discharge: 2020-03-24 | Disposition: A | Discharge status: Home / Self Care | Payer: 59 | Source: Ambulatory Visit | Attending: Obstetrics and Gynecology | Admitting: Obstetrics and Gynecology

## 2020-03-24 DIAGNOSIS — N920 Excessive and frequent menstruation with regular cycle: Secondary | ICD-10-CM

## 2020-03-30 ENCOUNTER — Ambulatory Visit (INDEPENDENT_AMBULATORY_CARE_PROVIDER_SITE_OTHER): Payer: 59 | Admitting: Family Medicine

## 2020-03-30 ENCOUNTER — Encounter (INDEPENDENT_AMBULATORY_CARE_PROVIDER_SITE_OTHER): Payer: Self-pay | Admitting: Family Medicine

## 2020-03-30 ENCOUNTER — Other Ambulatory Visit: Payer: Self-pay

## 2020-03-30 VITALS — BP 113/67 | HR 89 | Temp 98.1°F | Ht 69.0 in | Wt 363.0 lb

## 2020-03-30 DIAGNOSIS — R400 Somnolence: Secondary | ICD-10-CM

## 2020-03-30 DIAGNOSIS — E119 Type 2 diabetes mellitus without complications: Secondary | ICD-10-CM | POA: Diagnosis not present

## 2020-03-30 DIAGNOSIS — Z6841 Body Mass Index (BMI) 40.0 and over, adult: Secondary | ICD-10-CM

## 2020-03-30 DIAGNOSIS — I10 Essential (primary) hypertension: Secondary | ICD-10-CM | POA: Diagnosis not present

## 2020-03-30 DIAGNOSIS — Z9189 Other specified personal risk factors, not elsewhere classified: Secondary | ICD-10-CM

## 2020-03-30 DIAGNOSIS — E66813 Obesity, class 3: Secondary | ICD-10-CM

## 2020-03-30 DIAGNOSIS — Z1331 Encounter for screening for depression: Secondary | ICD-10-CM

## 2020-03-30 DIAGNOSIS — R5383 Other fatigue: Secondary | ICD-10-CM | POA: Diagnosis not present

## 2020-03-30 DIAGNOSIS — R0602 Shortness of breath: Secondary | ICD-10-CM | POA: Diagnosis not present

## 2020-03-30 DIAGNOSIS — Z0289 Encounter for other administrative examinations: Secondary | ICD-10-CM

## 2020-03-31 LAB — CBC WITH DIFFERENTIAL/PLATELET
Basophils Absolute: 0.1 10*3/uL (ref 0.0–0.2)
Basos: 1 %
EOS (ABSOLUTE): 0.2 10*3/uL (ref 0.0–0.4)
Eos: 2 %
Hematocrit: 38.1 % (ref 34.0–46.6)
Hemoglobin: 12.6 g/dL (ref 11.1–15.9)
Immature Grans (Abs): 0 10*3/uL (ref 0.0–0.1)
Immature Granulocytes: 0 %
Lymphocytes Absolute: 2.8 10*3/uL (ref 0.7–3.1)
Lymphs: 28 %
MCH: 29.2 pg (ref 26.6–33.0)
MCHC: 33.1 g/dL (ref 31.5–35.7)
MCV: 88 fL (ref 79–97)
Monocytes Absolute: 0.6 10*3/uL (ref 0.1–0.9)
Monocytes: 6 %
Neutrophils Absolute: 6.4 10*3/uL (ref 1.4–7.0)
Neutrophils: 63 %
Platelets: 332 10*3/uL (ref 150–450)
RBC: 4.31 x10E6/uL (ref 3.77–5.28)
RDW: 12.7 % (ref 11.7–15.4)
WBC: 10.1 10*3/uL (ref 3.4–10.8)

## 2020-03-31 LAB — COMPREHENSIVE METABOLIC PANEL
ALT: 36 IU/L — ABNORMAL HIGH (ref 0–32)
AST: 34 IU/L (ref 0–40)
Albumin/Globulin Ratio: 1.2 (ref 1.2–2.2)
Albumin: 4.2 g/dL (ref 3.9–5.0)
Alkaline Phosphatase: 87 IU/L (ref 48–121)
BUN/Creatinine Ratio: 11 (ref 9–23)
BUN: 10 mg/dL (ref 6–20)
Bilirubin Total: 0.2 mg/dL (ref 0.0–1.2)
CO2: 23 mmol/L (ref 20–29)
Calcium: 9.2 mg/dL (ref 8.7–10.2)
Chloride: 100 mmol/L (ref 96–106)
Creatinine, Ser: 0.92 mg/dL (ref 0.57–1.00)
GFR calc Af Amer: 97 mL/min/{1.73_m2} (ref >59)
GFR calc non Af Amer: 84 mL/min/{1.73_m2} (ref >59)
Globulin, Total: 3.4 g/dL (ref 1.5–4.5)
Glucose: 143 mg/dL — ABNORMAL HIGH (ref 65–99)
Potassium: 4 mmol/L (ref 3.5–5.2)
Sodium: 139 mmol/L (ref 134–144)
Total Protein: 7.6 g/dL (ref 6.0–8.5)

## 2020-03-31 LAB — TSH: TSH: 1.46 u[IU]/mL (ref 0.450–4.500)

## 2020-03-31 LAB — FOLATE: Folate: 3.7 ng/mL (ref >3.0)

## 2020-03-31 LAB — LIPID PANEL WITH LDL/HDL RATIO
Cholesterol, Total: 164 mg/dL (ref 100–199)
HDL: 44 mg/dL (ref >39)
LDL Chol Calc (NIH): 90 mg/dL (ref 0–99)
LDL/HDL Ratio: 2 ratio (ref 0.0–3.2)
Triglycerides: 172 mg/dL — ABNORMAL HIGH (ref 0–149)
VLDL Cholesterol Cal: 30 mg/dL (ref 5–40)

## 2020-03-31 LAB — T3: T3, Total: 128 ng/dL (ref 71–180)

## 2020-03-31 LAB — HEMOGLOBIN A1C
Est. average glucose Bld gHb Est-mCnc: 169 mg/dL
Hgb A1c MFr Bld: 7.5 % — ABNORMAL HIGH (ref 4.8–5.6)

## 2020-03-31 LAB — VITAMIN B12: Vitamin B-12: 300 pg/mL (ref 232–1245)

## 2020-03-31 LAB — T4, FREE: Free T4: 1.04 ng/dL (ref 0.82–1.77)

## 2020-03-31 LAB — VITAMIN D 25 HYDROXY (VIT D DEFICIENCY, FRACTURES): Vit D, 25-Hydroxy: 9.7 ng/mL — ABNORMAL LOW (ref 30.0–100.0)

## 2020-03-31 LAB — INSULIN, RANDOM: INSULIN: 106 u[IU]/mL — ABNORMAL HIGH (ref 2.6–24.9)

## 2020-04-03 NOTE — Progress Notes (Signed)
Chief Complaint:   OBESITY Frances [name] (MR# [id number]) is a 29 y.o. female who presents for evaluation and treatment of obesity and related comorbidities. Current BMI is Body mass index is 53.61 kg/m. Frances [name] has been struggling with her weight for many years and has been unsuccessful in either losing weight, maintaining weight loss, or reaching her healthy weight goal.  Frances [name] is currently in the action stage of change and ready to dedicate time achieving and maintaining a healthier weight. Frances [name] is interested in becoming our patient and working on intensive lifestyle modifications including (but not limited to) diet and exercise for weight loss.  Frances [name]'s habits were reviewed today and are as follows: Her family eats meals together, she thinks her family will eat healthier with her, her desired weight loss is 113 lbs, she started gaining weight in 3rd grade, her heaviest weight ever was 380 pounds, she is a picky eater and doesn't like to eat healthier foods, she has significant food cravings issues, she skips meals frequently, she is frequently drinking liquids with calories, she frequently makes poor food choices, she frequently eats larger portions than normal and she struggles with emotional eating.  Depression Screen Frances [name]'s Food and Mood (modified PHQ-9) score was 7.  Depression screen PHQ 2/9 03/30/2020  Decreased Interest 1  Down, Depressed, Hopeless 1  PHQ - 2 Score 2  Altered sleeping 0  Tired, decreased energy 1  Change in appetite 2  Feeling bad or failure about yourself  1  Trouble concentrating 1  Moving slowly or fidgety/restless 0  Suicidal thoughts 0  PHQ-9 Score 7  Difficult doing work/chores Somewhat difficult   Subjective:   1. Other fatigue Frances [name] admits to daytime somnolence and admits to waking up still tired. Patent has a history of symptoms of daytime fatigue. Frances [name] generally gets 4 or 6 hours of sleep per night, and states that she has  nightime awakenings. Snoring is present. Apneic episodes are present. Epworth Sleepiness Score is 19.  2. Shortness of breath on exertion Frances [name] notes increasing shortness of breath with exercising and seems to be worsening over time with weight gain. She notes getting out of breath sooner with activity than she used to. This has not gotten worse recently. Frances [name] denies shortness of breath at rest or orthopnea.  3. Type 2 diabetes mellitus without complication, without long-term current use of insulin (HCC) Frances [name] A1c on 01/13/2019 was 11.4. She is currently on metformin ER 500 mg 4 tablets once daily and Bydureon. She has not been checking her home BGs.   4. Essential hypertension Frances [name] blood pressure is well controlled at her office visit today. She is currently on hydrochlorothiazide 25 mg q daily.  5. Daytime somnolence Frances [name] has a positive Epworth score of 19. She reports daytime fatigue has been increasing over the years.  6. At risk for heart disease Frances [name] is at a higher than average risk for cardiovascular disease due to obesity, diabetes II, and hypertension.   Assessment/Plan:   1. Other fatigue Frances [name] does feel that her weight is causing her energy to be lower than it should be. Fatigue may be related to obesity, depression or many other causes. Labs will be ordered, and in the meanwhile, Frances [name] will focus on self care including making healthy food choices, increasing physical activity and focusing on stress reduction.  - EKG 12-Lead - CBC with Differential/Platelet - Lipid Panel With LDL/HDL Ratio - VITAMIN D 25 Hydroxy (Vit-D Deficiency, Fractures) - Vitamin B12 -  Folate - TSH - T4, free - T3  2. Shortness of breath on exertion Frances [name] does feel that she gets out of breath more easily that she used to when she exercises. Emerly's shortness of breath appears to be obesity related and exercise induced. She has agreed to work on weight loss and gradually increase  exercise to treat her exercise induced shortness of breath. Will continue to monitor closely.  3. Type 2 diabetes mellitus without complication, without long-term current use of insulin (HCC) Good blood sugar control is important to decrease the likelihood of diabetic complications such as nephropathy, neuropathy, limb loss, blindness, coronary artery disease, and death. Intensive lifestyle modification including diet, exercise and weight loss are the first line of treatment for diabetes. We will check labs today, and Frances [name] will start her Category 4 meal plan.  - Comprehensive metabolic panel - Hemoglobin A1c - Insulin, random  4. Essential hypertension Frances [name] will start her Category 4 meal plan, and will continue working on healthy weight loss and exercise to improve blood pressure control. We will watch for signs of hypotension as she continues her lifestyle modifications. We will check labs today, and Frances [name] will continue her anti-hypertensive medications.  - CBC with Differential/Platelet - Comprehensive metabolic panel - Lipid Panel With LDL/HDL Ratio  5. Daytime somnolence We will discuss a sleep study in the future.  6. Depression screening Frances [name] had a positive depression screening. Depression is commonly associated with obesity and often results in emotional eating behaviors. We will monitor this closely and work on CBT to help improve the non-hunger eating patterns. Referral to Psychology may be required if no improvement is seen as she continues in our clinic.  7. At risk for heart disease Frances [name] was given approximately 30 minutes of coronary artery disease prevention counseling today. She is 29 y.o. female and has risk factors for heart disease including obesity, diabetes II, and hypertension. We discussed intensive lifestyle modifications today with an emphasis on specific weight loss instructions and strategies.   Repetitive spaced learning was employed today to elicit  superior memory formation and behavioral change.  8. Class 3 severe obesity with serious comorbidity and body mass index (BMI) of 50.0 to 59.9 in adult, unspecified obesity type (HCC) Frances [name] is currently in the action stage of change and her goal is to continue with weight loss efforts. I recommend Frances [name] begin the structured treatment plan as follows:  She has agreed to the Category 4 Plan.  Exercise goals: As is.   Behavioral modification strategies: increasing lean protein intake, decreasing simple carbohydrates, decreasing eating out, meal planning and cooking strategies and celebration eating strategies.  She was informed of the importance of frequent follow-up visits to maximize her success with intensive lifestyle modifications for her multiple health conditions. She was informed we would discuss her lab results at her next visit unless there is a critical issue that needs to be addressed sooner. Frances [name] agreed to keep her next visit at the agreed upon time to discuss these results.  Objective:   Blood pressure 113/67, pulse 89, temperature 98.1 F (36.7 C), temperature source Oral, height 5\' 9"  (1.753 m), weight (!) 363 lb (164.7 kg), last menstrual period 03/23/2020, SpO2 100 %. Body mass index is 53.61 kg/m.  EKG: Normal sinus rhythm, rate 82 BPM.  Indirect Calorimeter completed today shows a VO2 of 432 and a REE of 3007.  Her calculated basal metabolic rate is 03/25/2020 thus her basal metabolic rate is better than expected.  General: Cooperative,  alert, well developed, in no acute distress. HEENT: Conjunctivae and lids unremarkable. Cardiovascular: Regular rhythm.  Lungs: Normal work of breathing. Neurologic: No focal deficits.   Lab Results  Component Value Date   CREATININE 0.92 03/30/2020   BUN 10 03/30/2020   NA 139 03/30/2020   K 4.0 03/30/2020   CL 100 03/30/2020   CO2 23 03/30/2020   Lab Results  Component Value Date   ALT 36 (H) 03/30/2020   AST 34 03/30/2020    ALKPHOS 87 03/30/2020   BILITOT 0.2 03/30/2020   Lab Results  Component Value Date   HGBA1C 7.5 (H) 03/30/2020   HGBA1C 11.4 (H) 01/13/2019   HGBA1C 8.7 (H) 02/12/2018   HGBA1C 6.3 (H) 01/23/2016   HGBA1C 5.9 (H) 09/22/2014   Lab Results  Component Value Date   INSULIN 106.0 (H) 03/30/2020   Lab Results  Component Value Date   TSH 1.460 03/30/2020   Lab Results  Component Value Date   CHOL 164 03/30/2020   HDL 44 03/30/2020   LDLCALC 90 03/30/2020   TRIG 172 (H) 03/30/2020   Lab Results  Component Value Date   WBC 10.1 03/30/2020   HGB 12.6 03/30/2020   HCT 38.1 03/30/2020   MCV 88 03/30/2020   PLT 332 03/30/2020   Lab Results  Component Value Date   FERRITIN 77 02/12/2018   Attestation Statements:   Reviewed by clinician on day of visit: allergies, medications, problem list, medical history, surgical history, family history, social history, and previous encounter notes.   I, Burt Knack, am acting as transcriptionist for Quillian Quince, MD.  I have reviewed the above documentation for accuracy and completeness, and I agree with the above. - Quillian Quince, MD

## 2020-04-13 ENCOUNTER — Ambulatory Visit (INDEPENDENT_AMBULATORY_CARE_PROVIDER_SITE_OTHER): Payer: 59 | Admitting: Family Medicine

## 2020-04-13 ENCOUNTER — Encounter (INDEPENDENT_AMBULATORY_CARE_PROVIDER_SITE_OTHER): Payer: Self-pay | Admitting: Family Medicine

## 2020-04-13 ENCOUNTER — Other Ambulatory Visit: Payer: Self-pay

## 2020-04-13 VITALS — BP 120/81 | HR 95 | Temp 98.1°F | Ht 69.0 in | Wt 357.0 lb

## 2020-04-13 DIAGNOSIS — E559 Vitamin D deficiency, unspecified: Secondary | ICD-10-CM | POA: Diagnosis not present

## 2020-04-13 DIAGNOSIS — I10 Essential (primary) hypertension: Secondary | ICD-10-CM

## 2020-04-13 DIAGNOSIS — E1169 Type 2 diabetes mellitus with other specified complication: Secondary | ICD-10-CM

## 2020-04-13 DIAGNOSIS — Z9189 Other specified personal risk factors, not elsewhere classified: Secondary | ICD-10-CM

## 2020-04-13 DIAGNOSIS — E1159 Type 2 diabetes mellitus with other circulatory complications: Secondary | ICD-10-CM

## 2020-04-13 DIAGNOSIS — Z6841 Body Mass Index (BMI) 40.0 and over, adult: Secondary | ICD-10-CM

## 2020-04-13 MED ORDER — VITAMIN D (ERGOCALCIFEROL) 1.25 MG (50000 UNIT) PO CAPS: 50000.0000 [IU] | ORAL_CAPSULE | ORAL | 0 refills | Status: AC

## 2020-04-13 NOTE — Progress Notes (Signed)
Chief Complaint:   OBESITY Frances [name] is here to discuss her progress with her obesity treatment plan along with follow-up of her obesity related diagnoses. Frances [name] is on the Category 4 Plan and states she is following her eating plan approximately 70% of the time. Frances [name] states she is doing Zumba for 30 minutes 2 times per week, and playing with the kids and being more mindful of her steps .  Today's visit was #: 2 Starting weight: 363 lbs Starting date: 03/30/2020 Today's weight: 357 lbs Today's date: 04/13/2020 Total lbs lost to date: 6 Total lbs lost since last in-office visit: 6  Interim History: [name] felt that the Category 4 meal plan was a lot of food. Breakfast was a struggle to consume every morning.  Subjective:   1. Vitamin D deficiency Frances [name]'s Vit D level on 03/30/2020 was 9.7. She is not on Vit D supplementation. I discussed labs with the patient today.  2. Type 2 diabetes mellitus with other specified complication, without long-term current use of insulin (HCC) Frances [name]'s A1c on 03/30/2020 was 7.5 and BG 143. She is on metformin XR 500 mg 4 tablets q daily and Bydureon 2 mg once weekly injection. She reports feelings of hypoglycemia when going long periods in between eating . She has been sporadically checking ambulatory BG, and post prandial ranges in 110's. I discussed labs with the patient today.  3. Hypertension associated with diabetes ([place]) [name]'s blood pressure is at goal at her office visit today. She is on hydrochlorothiazide 25 mg q daily. Her CMP on 03/30/2020 was stable. I discussed labs with the patient today.  4. At risk for osteoporosis Frances [name] is at higher risk of osteopenia and osteoporosis due to Vitamin D deficiency.   Assessment/Plan:   1. Vitamin D deficiency Low Vitamin D level contributes to fatigue and are associated with obesity, breast, and colon cancer. Frances [name] agreed to start prescription Vitamin D 50,000 IU every week and will follow-up for  routine testing of Vitamin D, at least 2-3 times per year to avoid over-replacement. We will recheck labs in 3 months.  - Vitamin D, Ergocalciferol, (DRISDOL) 1.25 MG (50000 UNIT) CAPS capsule; Take 1 capsule (50,000 Units total) by mouth every 7 (seven) days.  Dispense: 4 capsule; Refill: 0  2. Type 2 diabetes mellitus with other specified complication, without long-term current use of insulin (HCC) Good blood sugar control is important to decrease the likelihood of diabetic complications such as nephropathy, neuropathy, limb loss, blindness, coronary artery disease, and death. Intensive lifestyle modification including diet, exercise and weight loss are the first line of treatment for diabetes. Frances [name] will continue her current anti-diabetic regimen, and will continue her Category 4 meal plan.  3. Hypertension associated with diabetes ([place]) [name] will continue her Category 4 meal plan, and will continue working on healthy weight loss and exercise to improve blood pressure control. We will watch for signs of hypotension as she continues her lifestyle modifications. She will continue her current anti-hypertensive therapy.  4. At risk for osteoporosis Frances [name] was given approximately 15 minutes of osteoporosis prevention counseling today. Frances [name] is at risk for osteopenia and osteoporosis due to her Vitamin D deficiency. She was encouraged to take her Vitamin D and follow her higher calcium diet and increase strengthening exercise to help strengthen her bones and decrease her risk of osteopenia and osteoporosis.  Repetitive spaced learning was employed today to elicit superior memory formation and behavioral change.  5. Class 3 severe obesity with serious comorbidity  and body mass index (BMI) of 50.0 to 59.9 in adult, unspecified obesity type (HCC) Frances [name] is currently in the action stage of change. As such, her goal is to continue with weight loss efforts. She has agreed to the Category 4 Plan.    Handouts provided today: Insulin Resistance and Pre-diabetes, and Additional Breakfast Options.  Exercise goals: As is.  Behavioral modification strategies: increasing lean protein intake, decreasing simple carbohydrates, increasing water intake, meal planning and cooking strategies and celebration eating strategies.  Frances [name] has agreed to follow-up with our clinic in 2 weeks. She was informed of the importance of frequent follow-up visits to maximize her success with intensive lifestyle modifications for her multiple health conditions.   Objective:   Blood pressure 120/81, pulse 95, temperature 98.1 F (36.7 C), temperature source Oral, height 5\' 9"  (1.753 m), weight (!) 357 lb (161.9 kg), last menstrual period 03/23/2020, SpO2 99 %. Body mass index is 52.72 kg/m.  General: Cooperative, alert, well developed, in no acute distress. HEENT: Conjunctivae and lids unremarkable. Cardiovascular: Regular rhythm.  Lungs: Normal work of breathing. Neurologic: No focal deficits.   Lab Results  Component Value Date   CREATININE 0.92 03/30/2020   BUN 10 03/30/2020   NA 139 03/30/2020   K 4.0 03/30/2020   CL 100 03/30/2020   CO2 23 03/30/2020   Lab Results  Component Value Date   ALT 36 (H) 03/30/2020   AST 34 03/30/2020   ALKPHOS 87 03/30/2020   BILITOT 0.2 03/30/2020   Lab Results  Component Value Date   HGBA1C 7.5 (H) 03/30/2020   HGBA1C 11.4 (H) 01/13/2019   HGBA1C 8.7 (H) 02/12/2018   HGBA1C 6.3 (H) 01/23/2016   HGBA1C 5.9 (H) 09/22/2014   Lab Results  Component Value Date   INSULIN 106.0 (H) 03/30/2020   Lab Results  Component Value Date   TSH 1.460 03/30/2020   Lab Results  Component Value Date   CHOL 164 03/30/2020   HDL 44 03/30/2020   LDLCALC 90 03/30/2020   TRIG 172 (H) 03/30/2020   Lab Results  Component Value Date   WBC 10.1 03/30/2020   HGB 12.6 03/30/2020   HCT 38.1 03/30/2020   MCV 88 03/30/2020   PLT 332 03/30/2020   Lab Results  Component  Value Date   FERRITIN 77 02/12/2018   Attestation Statements:   Reviewed by clinician on day of visit: allergies, medications, problem list, medical history, surgical history, family history, social history, and previous encounter notes.   I, 04/14/2018, am acting as transcriptionist for Burt Knack, MD.  I have reviewed the above documentation for accuracy and completeness, and I agree with the above. -  Quillian Quince, MD

## 2020-05-01 ENCOUNTER — Ambulatory Visit (INDEPENDENT_AMBULATORY_CARE_PROVIDER_SITE_OTHER): Payer: 59 | Admitting: Family Medicine

## 2022-06-19 ENCOUNTER — Encounter (INDEPENDENT_AMBULATORY_CARE_PROVIDER_SITE_OTHER): Payer: Self-pay

## 2022-08-23 ENCOUNTER — Other Ambulatory Visit (HOSPITAL_COMMUNITY): Payer: Self-pay

## 2022-08-23 MED ORDER — OZEMPIC (2 MG/DOSE) 8 MG/3ML ~~LOC~~ SOPN
2.0000 mg | PEN_INJECTOR | SUBCUTANEOUS | 1 refills | Status: AC
  Filled 2022-08-23: qty 3, 28d supply, fill #0

## 2022-08-26 ENCOUNTER — Other Ambulatory Visit (HOSPITAL_COMMUNITY): Payer: Self-pay

## 2023-12-28 ENCOUNTER — Other Ambulatory Visit: Payer: Self-pay

## 2023-12-28 ENCOUNTER — Emergency Department (HOSPITAL_COMMUNITY)
Admission: EM | Admit: 2023-12-28 | Discharge: 2023-12-28 | Disposition: A | Discharge status: Home / Self Care | Payer: 59 | Attending: Emergency Medicine | Admitting: Emergency Medicine

## 2023-12-28 ENCOUNTER — Emergency Department (HOSPITAL_COMMUNITY): Payer: 59

## 2023-12-28 DIAGNOSIS — M25522 Pain in left elbow: Secondary | ICD-10-CM | POA: Diagnosis present

## 2023-12-28 DIAGNOSIS — I10 Essential (primary) hypertension: Secondary | ICD-10-CM | POA: Diagnosis not present

## 2023-12-28 DIAGNOSIS — E119 Type 2 diabetes mellitus without complications: Secondary | ICD-10-CM | POA: Insufficient documentation

## 2023-12-28 DIAGNOSIS — Z7984 Long term (current) use of oral hypoglycemic drugs: Secondary | ICD-10-CM | POA: Insufficient documentation

## 2023-12-28 DIAGNOSIS — M7712 Lateral epicondylitis, left elbow: Secondary | ICD-10-CM | POA: Diagnosis not present

## 2023-12-28 DIAGNOSIS — Z79899 Other long term (current) drug therapy: Secondary | ICD-10-CM | POA: Insufficient documentation

## 2023-12-28 MED ORDER — ACETAMINOPHEN 500 MG PO TABS
1000.0000 mg | ORAL_TABLET | Freq: Once | ORAL | Status: AC
  Administered 2023-12-28: 1000 mg via ORAL
  Filled 2023-12-28: qty 2

## 2023-12-28 MED ORDER — PREDNISONE 20 MG PO TABS: 40.0000 mg | ORAL_TABLET | Freq: Every day | ORAL | 0 refills | Status: AC

## 2023-12-28 NOTE — Discharge Instructions (Addendum)
You were seen in emergency room for elbow pain. Take Tylenol 100mg  four times a day. If sent medications to your pharmacy please take as prescribed.  Also recommend rest ice and elbow brace as discussed.  Please follow-up with primary care and return to emergency room if you have any new or worsening symptoms.

## 2023-12-28 NOTE — ED Provider Notes (Signed)
[place] EMERGENCY DEPARTMENT AT [facility] Provider Note   CSN: [id number] Arrival date & time: 12/28/23  1326     History  Chief Complaint  Patient presents with   Elbow Pain    left    Frances [name] is a 33 y.o. female with past medical history of hypertension, cardiomyopathy, diabetes, polycystic ovarian syndrome presenting to emergency room with left elbow pain that has progressively worsened over the past few days.  Patient denies any known injury however she noted the pain when she bumped her elbow against her chair at work.  Patient noted swelling.  She tried ibuprofen without significant relief of symptoms.  Denies chest pain, shortness of breath, fever, chills, abdominal pain.  HPI     Home Medications Prior to Admission medications   Medication Sig Start Date End Date Taking? Authorizing Provider  Exenatide ER (BYDUREON BCISE) 2 MG/0.85ML AUIJ Inject 2 mg into the skin once a week.    [provider]  hydrochlorothiazide (HYDRODIURIL) 25 MG tablet Take 25 mg by mouth daily.    [provider]  metFORMIN (GLUCOPHAGE-XR) 500 MG 24 hr tablet Take 4 tablets by mouth daily    [provider]  progesterone (PROMETRIUM) 200 MG capsule Take 200 mg by mouth. Take one tablet by mouth once a day for 14 days following period    [provider]  Semaglutide, 2 MG/DOSE, (OZEMPIC, 2 MG/DOSE,) 8 MG/3ML SOPN Inject 2 mg into the skin once a week. 05/30/22   Betsey Amen, MD  Vitamin D, Ergocalciferol, (DRISDOL) 1.25 MG (50000 UNIT) CAPS capsule Take 1 capsule (50,000 Units total) by mouth every 7 (seven) days. 04/13/20   Quillian Quince D, MD      Allergies    Lisinopril, Penicillins, and Metformin and related    Review of Systems   Review of Systems  Musculoskeletal:  Positive for arthralgias.    Physical Exam Updated Vital Signs BP (!) 147/89 (BP Location: Right Arm)   Pulse (!) 107   Temp 98.5 F (36.9 C) (Oral)   Resp  18   SpO2 96%  Physical Exam Vitals and nursing note reviewed.  Constitutional:      General: She is not in acute distress.    Appearance: She is not toxic-appearing.  HENT:     Head: Normocephalic and atraumatic.  Eyes:     General: No scleral icterus.    Conjunctiva/sclera: Conjunctivae normal.  Cardiovascular:     Rate and Rhythm: Normal rate and regular rhythm.     Pulses: Normal pulses.     Heart sounds: Normal heart sounds.  Pulmonary:     Effort: Pulmonary effort is normal. No respiratory distress.     Breath sounds: Normal breath sounds.  Abdominal:     General: Abdomen is flat. Bowel sounds are normal.     Palpations: Abdomen is soft.     Tenderness: There is no abdominal tenderness.  Musculoskeletal:     Comments: Left lateral elbow tenderness to palpation.  Patient has reproduction of symptoms with restricted wrist extension.   Skin:    General: Skin is warm and dry.     Findings: No lesion.  Neurological:     General: No focal deficit present.     Mental Status: She is alert and oriented to person, place, and time. Mental status is at baseline.     ED Results / Procedures / Treatments   Labs (all labs ordered are listed, but only abnormal  results are displayed) Labs Reviewed - No data to display  EKG None  Radiology No results found.  Procedures Procedures    Medications Ordered in ED Medications  acetaminophen (TYLENOL) tablet 1,000 mg (has no administration in time range)    ED Course/ Medical Decision Making/ A&P                                 Medical Decision Making Amount and/or Complexity of Data Reviewed Radiology: ordered.  Risk OTC drugs. Prescription drug management.   This patient presents to the ED for concern of left elbow pain, this involves an extensive number of treatment options, and is a complaint that carries with it a high risk of complications and morbidity.  The differential diagnosis includes fracture, strain,  sprain, epicondylitis     Imaging Studies ordered:  I ordered imaging studies including elbow x-ray I independently visualized and interpreted imaging which showed no acute findings  I agree with the radiologist interpretation    Problem List / ED Course / Critical interventions / Medication management  Patient presenting with left elbow pain.  She had no injury trauma or fall.  Neurovascularly intact.  Although she has discomfort with elbow extension and wrist extension she has range of motion and strength equal bilaterally.  She is neurovascularly intact.  Symptoms consistent with epicondylitis. Reassuring x-ray for patient's stable for follow-up with primary care.  I ordered medication including Tylenol Reevaluation of the patient after these medicines showed that the patient improved I have reviewed the patients home medicines and have made adjustments as needed   Plan  F/u w/ PCP in 2-3d to ensure resolution of sx.  Patient was given return precautions. Patient stable for discharge at this time.  Patient educated on sx/dx and verbalized understanding of plan. Return to ER w/ new or worsening sx.          Final Clinical Impression(s) / ED Diagnoses Final diagnoses:  Lateral epicondylitis of left elbow    Rx / DC Orders ED Discharge Orders     None         Smitty Knudsen, PA-C 12/28/23 1529    Gloris Manchester, MD 12/28/23 [phone]

## 2023-12-28 NOTE — ED Triage Notes (Signed)
Pt arrives with c/o left elbow pain and swelling. Pt fell out of her chair 2 days ago, unsure of whether this is how she injured it.
